# Patient Record
Sex: Male | Born: 1955 | Race: Black or African American | Hispanic: No | Marital: Married | Smoking: Current every day smoker
Health system: Southern US, Community
[De-identification: ages and names within clinical notes are randomized; demographics above are authoritative.]

## PROBLEM LIST (undated history)

## (undated) DIAGNOSIS — E785 Hyperlipidemia, unspecified: Secondary | ICD-10-CM

## (undated) DIAGNOSIS — E119 Type 2 diabetes mellitus without complications: Secondary | ICD-10-CM

## (undated) DIAGNOSIS — I10 Essential (primary) hypertension: Secondary | ICD-10-CM

## (undated) DIAGNOSIS — H409 Unspecified glaucoma: Secondary | ICD-10-CM

## (undated) DIAGNOSIS — Z8619 Personal history of other infectious and parasitic diseases: Secondary | ICD-10-CM

## (undated) DIAGNOSIS — C801 Malignant (primary) neoplasm, unspecified: Secondary | ICD-10-CM

## (undated) DIAGNOSIS — K219 Gastro-esophageal reflux disease without esophagitis: Secondary | ICD-10-CM

## (undated) DIAGNOSIS — C220 Liver cell carcinoma: Secondary | ICD-10-CM

## (undated) HISTORY — DX: Hyperlipidemia, unspecified: E78.5

## (undated) HISTORY — DX: Unspecified glaucoma: H40.9

## (undated) HISTORY — DX: Essential (primary) hypertension: I10

## (undated) HISTORY — DX: Personal history of other infectious and parasitic diseases: Z86.19

## (undated) HISTORY — PX: ROTATOR CUFF REPAIR: SHX139

## (undated) HISTORY — DX: Liver cell carcinoma: C22.0

## (undated) HISTORY — DX: Gastro-esophageal reflux disease without esophagitis: K21.9

## (undated) HISTORY — DX: Type 2 diabetes mellitus without complications: E11.9

## (undated) MED FILL — Ipilimumab Soln for IV Infusion 200 MG/40ML (5 MG/ML): INTRAVENOUS | Qty: 50 | Status: AC

## (undated) NOTE — *Deleted (*Deleted)
[facility]  [address] [place],  [place]  [address] [phone]  Clinic Day:  07/26/2020  Referring physician: Lonie Peak, PA-C   CHIEF COMPLAINT:  CC: Hepatocellular Carcinoma  Current Treatment:  ipilimumab/nivolumab   HISTORY OF PRESENT ILLNESS:  The patient is a 81 year old with stage IIIA (T3 N0 M0) multifocal hepatocellular carcinoma diagnosed in February 2021.  He was referred by Lonie Peak, PA-C in January due to abnormal liver function tests.  He has a history of hepatitis C, which was treated.  Due to abdominal pain and bloating, diarrhea, edema and right upper quadrant ultrasound was done in January.  This revealed at least 3 masses within the liver, the largest measured 10.2 x 9.3 x 9.5 cm.  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  The patient was started on rivaroxaban and then referred for consultation.  He had worsening anemia with a hemoglobin of 10.1.  B12 and folate were normal.  Iron studies were equivocal with a low serum iron of 34 and% saturation of 8.3% and high normal TIBC of 409, but ferritin was normal at 122.  A soluble transferrin was obtained and was very elevated, consistent with iron deficiency.  Alpha-fetoprotein was normal, but his CA 19-9 was elevated.  CT chest, abdomen, and pelvis in January confirmed the multiple hepatic masses, favored to represent multifocal hepatocellular carcinoma in the setting of cirrhosis.  The largest mass measured 9.8 x 7.5 cm in the high right hepatic lobe.  Perihepatic hemorrhage was centered about the left hepatic lobe, including a hematoma measuring 8.1 x 11.0 cm.  No acute process or evidence of metastatic disease was seen in the chest.  MRI head did not reveal any intracranial metastasis.  Core liver biopsy was done in February.  Pathology from this procedure revealed well to moderately  differentiated hepatocellular carcinoma, grade 1.  Due to the perihepatic hemorrhage, rivaroxaban was discontinued.  Due to the iron deficiency, he was advised start on oral iron supplement.  He had worsening hyperbilirubinemia in February prior to starting treatment, with his bilirubin up to 7.6, from 1.4.  Palliative treatment with single agent atezolizumab was recommended and started on February 25th.  This is usually given in combination with bevacizumab, but due to the hemorrhage within the tumor, we did not recommend bevacizumab.  His bilirubin went down to 5.7.  His 2nd cycle of palliative atezolizumab was delayed on March 18th due to worsening diarrhea, which he had for about 2 weeks.  Stool studies were negative.   He did not tolerate oral iron supplement, so we gave him IV iron in the form of Feraheme.  His CA 19-9 had increased from 246 in January to 358 in April, then down to 282 in early May.    CT chest, abdomen and pelvis from May 24th revealed the dominant right hepatic lesion is progressive in the interval, now measuring 11.6 x 9.8 cm, previously 9.8 x 7.5 cm.  The left hepatic mass remains poorly defined making reproducible comparative measurement difficult, but this lesion is similar to prior, now measuring 6.9 x 5.0 cm, previously 7.6 x 5.7 cm.  There is a progressive enhancing lesion in the inferior right liver measuring 2.9 cm.  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  Question of  IVC thrombus on the previous study is less evident.  With these results, we stopped his treatment.  The patient started Lenvima 12 mg daily on 02/10/2020.  On 02/17/2020, he called reporting severe diarrhea that was not improved with Imodium.  We decreased his dose due to toxicities and started Lomotil p.r.n..  He then required Lenvima to be held due to Grade 3 hepatotoxicity for a total of two weeks.  He  restarted Lenvima at 8 mg but continued to have severe toxicities of diarrhea and weight loss.  He was unable to tolerate even 4 mg of the Lenvima. The oxycodone was not sufficient to relieve his pain and he was changed to hydromorphone. Tramadol and hydrocodone did not provide sufficient relief.  He has not tolerated oxycodone, so he was placed on morphine 15 mg Q4 prn.  CT abdomen and pelvis from September 21st revealed multiple enlarging, bulky liver masses, and an additional new lesion, measuring 1.5 x 1.3 cm.  The largest mass in the right lobe measures 12.3 x 10.0 cm, previously 11.6 x 9.8 cm.  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  His last CA 19-9 from early September was 438, previously 299 in July.  He was started on ipilimumab/nivolumab immunotherapy on September 28th.   INTERVAL HISTORY:  Philbert is here for follow up since experiencing dizziness with a fall last night. He states as he was walking down the stairs, he became light-headed and fell down the steps hitting his left side along the way including his head. He does think he had a slight loss of consciousness. Today he reports being very sore, mainly his left ankle, leg and chest. He also reports not being able to eat or drink well since Sunday of last week with today being Thursday. He is concerned that he may have injured himself and also feels dehydrated. We will plan for administration of IVF along with imaging of his left side as well as a head CT. He denies fever, chills, nausea or vomiting. He denies issue with bowel or bladder. He has had increasing shortness of breath as well as fatigue. CBC and CMP are unremarkable today.  He is here for follow up and continued supportive care.  He will be due for a 3rd cycle of ipilimumab/nivolumab on November 24th.   His  appetite is good, and he has gained/lost _ pounds since his last visit.  He denies fever, chills or other signs of infection.  He denies  nausea, vomiting, bowel issues, or abdominal pain.  He denies sore throat, cough, dyspnea, or chest pain.   REVIEW OF SYSTEMS:  Review of Systems - Oncology   VITALS:  There were no vitals taken for this visit.  Wt Readings from Last 3 Encounters:  07/13/20 180 lb (81.6 kg)  07/11/20 174 lb 5 oz (79.1 kg)  07/11/20 181 lb 12.8 oz (82.5 kg)    There is no height or weight on file to calculate BMI.  Performance status (ECOG): 1 - Symptomatic but completely ambulatory  PHYSICAL EXAM:  Physical Exam LABS:   STUDIES:    Allergies:  Allergies  Allergen Reactions  . Hydromorphone Itching  . Lortab [Hydrocodone-Acetaminophen] Itching    Current Medications: Current Outpatient Medications  Medication Sig Dispense Refill  . diphenoxylate-atropine (LOMOTIL) 2.5-0.025 MG tablet Take by mouth.    . furosemide (LASIX) 20 MG tablet Take 20 mg by mouth daily.    Marland Kitchen glimepiride (AMARYL) 2 MG tablet     .  HYDROmorphone (DILAUDID) 2 MG tablet Take 2 mg by mouth 4 (four) times daily.    . hydrOXYzine (ATARAX/VISTARIL) 25 MG tablet Take 25 mg by mouth 3 (three) times daily as needed.    . hydrOXYzine (VISTARIL) 25 MG capsule TAKE 1 CAPSULE BY MOUTH EVERY 6 HOURS AS NEEDED FOR ITCHING 60 capsule 0  . lisinopril (ZESTRIL) 5 MG tablet Take 5 mg by mouth daily.    Marland Kitchen loperamide (IMODIUM) 2 MG capsule Take by mouth as needed for diarrhea or loose stools.    . mirtazapine (REMERON) 30 MG tablet Take 30 mg by mouth at bedtime.    Marland Kitchen morphine (MS CONTIN) 15 MG 12 hr tablet Take 15 mg by mouth 2 (two) times daily as needed.    . Multiple Vitamin (MULTIVITAMIN) tablet Take 1 tablet by mouth daily.    . ondansetron (ZOFRAN) 4 MG tablet Take 4 mg by mouth every 8 (eight) hours as needed for nausea or vomiting.    . polyethylene glycol (MIRALAX / GLYCOLAX) 17 g packet Take 17 g by mouth daily.    . prochlorperazine (COMPAZINE) 10 MG tablet Take 10 mg by mouth every 6 (six) hours as needed for nausea or  vomiting.    . sennosides-docusate sodium (SENOKOT-S) 8.6-50 MG tablet Take 1 tablet by mouth daily.     No current facility-administered medications for this visit.     ASSESSMENT & PLAN:   Assessment:  1. Stage IIIA hepatocellular carcinoma, for which he was receiving palliative atezolizumab. However, imaging results revealed progressive disease, and was therefore stopped.  He began lenvatinib 12 mg daily on 02/10/2020.  This was decreased to 8 mg daily on 02/17/2020. He was unable to tolerate this even after holding the dose and then decreasing to 4mg  daily. Due to continued toxicities, Lenvima was discontinued.  We have now started him on ipilimumab/ nivolumab which he is tolerating fairly well.  2. Hemorrhage into the tumor, resolved.    3. Iron deficiency anemia, improved on IV iron.  He has not required IV iron since March.    4. Hyperbilirubinemia and elevation of the liver transaminases.  These have started to improve, but we will still need to hold his therapy for another week.  Most likely this is treatment induced.  5. 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  He has been instructed to stay off anticoagulant or aspirin.  6. History of hepatitis C, treated.  7. Chronic lower extremity swelling and pain, which is stable.  He continues hydrochlorothiazide.  8. Renal insufficiency, stable.    9.  Elevated liver function tests for the last 3 weeks, improving, so likely from his treatment.  I cannot rule out that this is related to his malignancy.  10. Recent fall with LOC. Pain to left side. Imaging negative for fractures; however, he is noted to have a 7 mm right subdural hematoma. We will observe as there is no intervention indicated.    Plan: We discussed for him to have close observation by his wife at home. They are to call with any changes especially mental status changes. He will use ice as necessary with elevation to his left ankle. He has pain medicines to relieve  his pain. He understands it may take several days to recover from this fall. We will give 1 liter of normal saline today. He will return to clinic next week for repeat evaluation.   I provided 30 minutes (9:35 -10:04 AM) of face-to-face time during this this encounter and >  50% was spent counseling as documented under my assessment and plan.    Dellia Beckwith, MD [facility] [place]   .

---

## 2019-07-23 DIAGNOSIS — E785 Hyperlipidemia, unspecified: Secondary | ICD-10-CM | POA: Diagnosis not present

## 2019-07-23 DIAGNOSIS — R5383 Other fatigue: Secondary | ICD-10-CM | POA: Diagnosis not present

## 2019-07-23 DIAGNOSIS — Z23 Encounter for immunization: Secondary | ICD-10-CM | POA: Diagnosis not present

## 2019-07-23 DIAGNOSIS — E119 Type 2 diabetes mellitus without complications: Secondary | ICD-10-CM | POA: Diagnosis not present

## 2019-07-23 DIAGNOSIS — I10 Essential (primary) hypertension: Secondary | ICD-10-CM | POA: Diagnosis not present

## 2019-08-20 DIAGNOSIS — L851 Acquired keratosis [keratoderma] palmaris et plantaris: Secondary | ICD-10-CM | POA: Diagnosis not present

## 2019-08-20 DIAGNOSIS — L600 Ingrowing nail: Secondary | ICD-10-CM | POA: Diagnosis not present

## 2019-08-20 DIAGNOSIS — B351 Tinea unguium: Secondary | ICD-10-CM | POA: Diagnosis not present

## 2019-08-20 DIAGNOSIS — E119 Type 2 diabetes mellitus without complications: Secondary | ICD-10-CM | POA: Diagnosis not present

## 2019-08-20 DIAGNOSIS — M79671 Pain in right foot: Secondary | ICD-10-CM | POA: Diagnosis not present

## 2019-09-13 DIAGNOSIS — L600 Ingrowing nail: Secondary | ICD-10-CM | POA: Diagnosis not present

## 2019-09-13 DIAGNOSIS — B351 Tinea unguium: Secondary | ICD-10-CM | POA: Diagnosis not present

## 2019-09-13 DIAGNOSIS — M79671 Pain in right foot: Secondary | ICD-10-CM | POA: Diagnosis not present

## 2019-09-13 DIAGNOSIS — E119 Type 2 diabetes mellitus without complications: Secondary | ICD-10-CM | POA: Diagnosis not present

## 2019-09-17 DIAGNOSIS — R609 Edema, unspecified: Secondary | ICD-10-CM | POA: Diagnosis not present

## 2019-09-17 DIAGNOSIS — I10 Essential (primary) hypertension: Secondary | ICD-10-CM | POA: Diagnosis not present

## 2019-09-17 DIAGNOSIS — L723 Sebaceous cyst: Secondary | ICD-10-CM | POA: Diagnosis not present

## 2019-09-27 DIAGNOSIS — R160 Hepatomegaly, not elsewhere classified: Secondary | ICD-10-CM | POA: Diagnosis not present

## 2019-09-27 DIAGNOSIS — K828 Other specified diseases of gallbladder: Secondary | ICD-10-CM | POA: Diagnosis not present

## 2019-09-27 DIAGNOSIS — R748 Abnormal levels of other serum enzymes: Secondary | ICD-10-CM | POA: Diagnosis not present

## 2019-09-27 DIAGNOSIS — I81 Portal vein thrombosis: Secondary | ICD-10-CM | POA: Diagnosis not present

## 2019-09-27 DIAGNOSIS — R188 Other ascites: Secondary | ICD-10-CM | POA: Diagnosis not present

## 2019-09-27 DIAGNOSIS — K8020 Calculus of gallbladder without cholecystitis without obstruction: Secondary | ICD-10-CM | POA: Diagnosis not present

## 2019-09-27 DIAGNOSIS — C220 Liver cell carcinoma: Secondary | ICD-10-CM | POA: Insufficient documentation

## 2019-10-05 DIAGNOSIS — R188 Other ascites: Secondary | ICD-10-CM | POA: Diagnosis not present

## 2019-10-05 DIAGNOSIS — E785 Hyperlipidemia, unspecified: Secondary | ICD-10-CM | POA: Diagnosis not present

## 2019-10-05 DIAGNOSIS — E119 Type 2 diabetes mellitus without complications: Secondary | ICD-10-CM | POA: Diagnosis not present

## 2019-10-05 DIAGNOSIS — R945 Abnormal results of liver function studies: Secondary | ICD-10-CM | POA: Diagnosis not present

## 2019-10-05 DIAGNOSIS — I82220 Acute embolism and thrombosis of inferior vena cava: Secondary | ICD-10-CM | POA: Diagnosis not present

## 2019-10-05 DIAGNOSIS — Z79899 Other long term (current) drug therapy: Secondary | ICD-10-CM | POA: Diagnosis not present

## 2019-10-05 DIAGNOSIS — E538 Deficiency of other specified B group vitamins: Secondary | ICD-10-CM | POA: Diagnosis not present

## 2019-10-05 DIAGNOSIS — I81 Portal vein thrombosis: Secondary | ICD-10-CM | POA: Diagnosis not present

## 2019-10-05 DIAGNOSIS — R978 Other abnormal tumor markers: Secondary | ICD-10-CM | POA: Diagnosis not present

## 2019-10-05 DIAGNOSIS — I10 Essential (primary) hypertension: Secondary | ICD-10-CM | POA: Diagnosis not present

## 2019-10-05 DIAGNOSIS — R160 Hepatomegaly, not elsewhere classified: Secondary | ICD-10-CM | POA: Diagnosis not present

## 2019-10-06 DIAGNOSIS — Z20822 Contact with and (suspected) exposure to covid-19: Secondary | ICD-10-CM | POA: Diagnosis not present

## 2019-10-08 DIAGNOSIS — K7689 Other specified diseases of liver: Secondary | ICD-10-CM | POA: Diagnosis not present

## 2019-10-08 DIAGNOSIS — J439 Emphysema, unspecified: Secondary | ICD-10-CM | POA: Diagnosis not present

## 2019-10-08 DIAGNOSIS — R945 Abnormal results of liver function studies: Secondary | ICD-10-CM | POA: Diagnosis not present

## 2019-10-08 DIAGNOSIS — R9389 Abnormal findings on diagnostic imaging of other specified body structures: Secondary | ICD-10-CM | POA: Diagnosis not present

## 2019-10-12 DIAGNOSIS — R160 Hepatomegaly, not elsewhere classified: Secondary | ICD-10-CM | POA: Diagnosis not present

## 2019-10-12 DIAGNOSIS — K7689 Other specified diseases of liver: Secondary | ICD-10-CM | POA: Diagnosis not present

## 2019-10-14 DIAGNOSIS — R9389 Abnormal findings on diagnostic imaging of other specified body structures: Secondary | ICD-10-CM | POA: Diagnosis not present

## 2019-10-14 DIAGNOSIS — Z7901 Long term (current) use of anticoagulants: Secondary | ICD-10-CM | POA: Diagnosis not present

## 2019-10-18 ENCOUNTER — Encounter: Payer: Self-pay | Admitting: Oncology

## 2019-10-18 DIAGNOSIS — R945 Abnormal results of liver function studies: Secondary | ICD-10-CM | POA: Diagnosis not present

## 2019-10-18 DIAGNOSIS — R160 Hepatomegaly, not elsewhere classified: Secondary | ICD-10-CM | POA: Diagnosis not present

## 2019-10-18 DIAGNOSIS — C220 Liver cell carcinoma: Secondary | ICD-10-CM | POA: Diagnosis not present

## 2019-10-22 DIAGNOSIS — C787 Secondary malignant neoplasm of liver and intrahepatic bile duct: Secondary | ICD-10-CM | POA: Diagnosis not present

## 2019-10-22 DIAGNOSIS — D649 Anemia, unspecified: Secondary | ICD-10-CM | POA: Diagnosis not present

## 2019-10-22 DIAGNOSIS — C220 Liver cell carcinoma: Secondary | ICD-10-CM | POA: Diagnosis not present

## 2019-10-23 DIAGNOSIS — D509 Iron deficiency anemia, unspecified: Secondary | ICD-10-CM | POA: Insufficient documentation

## 2019-10-23 DIAGNOSIS — D689 Coagulation defect, unspecified: Secondary | ICD-10-CM | POA: Insufficient documentation

## 2019-10-25 DIAGNOSIS — E785 Hyperlipidemia, unspecified: Secondary | ICD-10-CM | POA: Diagnosis not present

## 2019-10-25 DIAGNOSIS — E119 Type 2 diabetes mellitus without complications: Secondary | ICD-10-CM | POA: Diagnosis not present

## 2019-10-25 DIAGNOSIS — I10 Essential (primary) hypertension: Secondary | ICD-10-CM | POA: Diagnosis not present

## 2019-10-25 DIAGNOSIS — K219 Gastro-esophageal reflux disease without esophagitis: Secondary | ICD-10-CM | POA: Diagnosis not present

## 2019-10-25 DIAGNOSIS — M5440 Lumbago with sciatica, unspecified side: Secondary | ICD-10-CM | POA: Diagnosis not present

## 2019-10-29 ENCOUNTER — Encounter: Payer: Self-pay | Admitting: Oncology

## 2019-10-29 DIAGNOSIS — Z452 Encounter for adjustment and management of vascular access device: Secondary | ICD-10-CM | POA: Diagnosis not present

## 2019-10-29 DIAGNOSIS — C220 Liver cell carcinoma: Secondary | ICD-10-CM | POA: Diagnosis not present

## 2019-11-04 DIAGNOSIS — Z79899 Other long term (current) drug therapy: Secondary | ICD-10-CM | POA: Diagnosis not present

## 2019-11-04 DIAGNOSIS — C220 Liver cell carcinoma: Secondary | ICD-10-CM | POA: Diagnosis not present

## 2019-11-15 DIAGNOSIS — C220 Liver cell carcinoma: Secondary | ICD-10-CM | POA: Diagnosis not present

## 2019-11-25 DIAGNOSIS — R945 Abnormal results of liver function studies: Secondary | ICD-10-CM | POA: Diagnosis not present

## 2019-11-25 DIAGNOSIS — C220 Liver cell carcinoma: Secondary | ICD-10-CM | POA: Diagnosis not present

## 2019-11-25 DIAGNOSIS — R600 Localized edema: Secondary | ICD-10-CM | POA: Diagnosis not present

## 2019-11-25 DIAGNOSIS — D509 Iron deficiency anemia, unspecified: Secondary | ICD-10-CM | POA: Diagnosis not present

## 2019-11-25 DIAGNOSIS — E860 Dehydration: Secondary | ICD-10-CM | POA: Diagnosis not present

## 2019-11-25 DIAGNOSIS — R197 Diarrhea, unspecified: Secondary | ICD-10-CM | POA: Diagnosis not present

## 2019-11-30 DIAGNOSIS — C220 Liver cell carcinoma: Secondary | ICD-10-CM | POA: Diagnosis not present

## 2019-12-07 DIAGNOSIS — C220 Liver cell carcinoma: Secondary | ICD-10-CM | POA: Diagnosis not present

## 2019-12-07 DIAGNOSIS — D509 Iron deficiency anemia, unspecified: Secondary | ICD-10-CM | POA: Diagnosis not present

## 2019-12-21 DIAGNOSIS — R978 Other abnormal tumor markers: Secondary | ICD-10-CM | POA: Diagnosis not present

## 2019-12-21 DIAGNOSIS — C220 Liver cell carcinoma: Secondary | ICD-10-CM | POA: Diagnosis not present

## 2019-12-21 DIAGNOSIS — C787 Secondary malignant neoplasm of liver and intrahepatic bile duct: Secondary | ICD-10-CM | POA: Diagnosis not present

## 2019-12-21 DIAGNOSIS — D649 Anemia, unspecified: Secondary | ICD-10-CM | POA: Diagnosis not present

## 2020-01-11 DIAGNOSIS — C787 Secondary malignant neoplasm of liver and intrahepatic bile duct: Secondary | ICD-10-CM | POA: Diagnosis not present

## 2020-01-11 DIAGNOSIS — D649 Anemia, unspecified: Secondary | ICD-10-CM | POA: Diagnosis not present

## 2020-01-11 DIAGNOSIS — Z79899 Other long term (current) drug therapy: Secondary | ICD-10-CM | POA: Diagnosis not present

## 2020-01-11 DIAGNOSIS — C220 Liver cell carcinoma: Secondary | ICD-10-CM | POA: Diagnosis not present

## 2020-01-21 DIAGNOSIS — E119 Type 2 diabetes mellitus without complications: Secondary | ICD-10-CM | POA: Diagnosis not present

## 2020-01-21 DIAGNOSIS — I10 Essential (primary) hypertension: Secondary | ICD-10-CM | POA: Diagnosis not present

## 2020-01-21 DIAGNOSIS — C220 Liver cell carcinoma: Secondary | ICD-10-CM | POA: Diagnosis not present

## 2020-01-21 DIAGNOSIS — E785 Hyperlipidemia, unspecified: Secondary | ICD-10-CM | POA: Diagnosis not present

## 2020-01-31 DIAGNOSIS — D649 Anemia, unspecified: Secondary | ICD-10-CM | POA: Diagnosis not present

## 2020-01-31 DIAGNOSIS — C220 Liver cell carcinoma: Secondary | ICD-10-CM | POA: Diagnosis not present

## 2020-01-31 DIAGNOSIS — C787 Secondary malignant neoplasm of liver and intrahepatic bile duct: Secondary | ICD-10-CM | POA: Diagnosis not present

## 2020-01-31 DIAGNOSIS — C229 Malignant neoplasm of liver, not specified as primary or secondary: Secondary | ICD-10-CM | POA: Diagnosis not present

## 2020-02-01 DIAGNOSIS — H348312 Tributary (branch) retinal vein occlusion, right eye, stable: Secondary | ICD-10-CM | POA: Diagnosis not present

## 2020-02-01 DIAGNOSIS — H353131 Nonexudative age-related macular degeneration, bilateral, early dry stage: Secondary | ICD-10-CM | POA: Diagnosis not present

## 2020-02-02 DIAGNOSIS — C787 Secondary malignant neoplasm of liver and intrahepatic bile duct: Secondary | ICD-10-CM | POA: Diagnosis not present

## 2020-02-02 DIAGNOSIS — C220 Liver cell carcinoma: Secondary | ICD-10-CM | POA: Diagnosis not present

## 2020-02-02 DIAGNOSIS — R945 Abnormal results of liver function studies: Secondary | ICD-10-CM | POA: Diagnosis not present

## 2020-02-02 DIAGNOSIS — R600 Localized edema: Secondary | ICD-10-CM | POA: Diagnosis not present

## 2020-02-02 DIAGNOSIS — D649 Anemia, unspecified: Secondary | ICD-10-CM | POA: Diagnosis not present

## 2020-02-02 DIAGNOSIS — D509 Iron deficiency anemia, unspecified: Secondary | ICD-10-CM | POA: Diagnosis not present

## 2020-02-22 DIAGNOSIS — M8589 Other specified disorders of bone density and structure, multiple sites: Secondary | ICD-10-CM | POA: Diagnosis not present

## 2020-02-22 DIAGNOSIS — D509 Iron deficiency anemia, unspecified: Secondary | ICD-10-CM | POA: Diagnosis not present

## 2020-02-22 DIAGNOSIS — C220 Liver cell carcinoma: Secondary | ICD-10-CM | POA: Diagnosis not present

## 2020-02-29 DIAGNOSIS — M79604 Pain in right leg: Secondary | ICD-10-CM | POA: Diagnosis not present

## 2020-02-29 DIAGNOSIS — N289 Disorder of kidney and ureter, unspecified: Secondary | ICD-10-CM | POA: Diagnosis not present

## 2020-02-29 DIAGNOSIS — D509 Iron deficiency anemia, unspecified: Secondary | ICD-10-CM | POA: Diagnosis not present

## 2020-02-29 DIAGNOSIS — Z8619 Personal history of other infectious and parasitic diseases: Secondary | ICD-10-CM | POA: Diagnosis not present

## 2020-02-29 DIAGNOSIS — M7989 Other specified soft tissue disorders: Secondary | ICD-10-CM | POA: Diagnosis not present

## 2020-02-29 DIAGNOSIS — R945 Abnormal results of liver function studies: Secondary | ICD-10-CM | POA: Diagnosis not present

## 2020-02-29 DIAGNOSIS — M79605 Pain in left leg: Secondary | ICD-10-CM | POA: Diagnosis not present

## 2020-02-29 DIAGNOSIS — C220 Liver cell carcinoma: Secondary | ICD-10-CM | POA: Diagnosis not present

## 2020-02-29 DIAGNOSIS — D688 Other specified coagulation defects: Secondary | ICD-10-CM | POA: Diagnosis not present

## 2020-03-01 DIAGNOSIS — H353131 Nonexudative age-related macular degeneration, bilateral, early dry stage: Secondary | ICD-10-CM | POA: Diagnosis not present

## 2020-03-01 DIAGNOSIS — H34831 Tributary (branch) retinal vein occlusion, right eye, with macular edema: Secondary | ICD-10-CM | POA: Diagnosis not present

## 2020-03-07 DIAGNOSIS — D689 Coagulation defect, unspecified: Secondary | ICD-10-CM | POA: Diagnosis not present

## 2020-03-07 DIAGNOSIS — Z8619 Personal history of other infectious and parasitic diseases: Secondary | ICD-10-CM | POA: Diagnosis not present

## 2020-03-07 DIAGNOSIS — C220 Liver cell carcinoma: Secondary | ICD-10-CM | POA: Diagnosis not present

## 2020-03-07 DIAGNOSIS — D509 Iron deficiency anemia, unspecified: Secondary | ICD-10-CM | POA: Diagnosis not present

## 2020-03-07 DIAGNOSIS — R600 Localized edema: Secondary | ICD-10-CM | POA: Diagnosis not present

## 2020-03-07 DIAGNOSIS — R945 Abnormal results of liver function studies: Secondary | ICD-10-CM | POA: Diagnosis not present

## 2020-03-22 DIAGNOSIS — R600 Localized edema: Secondary | ICD-10-CM | POA: Diagnosis not present

## 2020-03-22 DIAGNOSIS — D688 Other specified coagulation defects: Secondary | ICD-10-CM | POA: Diagnosis not present

## 2020-03-22 DIAGNOSIS — D509 Iron deficiency anemia, unspecified: Secondary | ICD-10-CM | POA: Diagnosis not present

## 2020-03-22 DIAGNOSIS — C220 Liver cell carcinoma: Secondary | ICD-10-CM | POA: Diagnosis not present

## 2020-04-03 DIAGNOSIS — C220 Liver cell carcinoma: Secondary | ICD-10-CM | POA: Diagnosis not present

## 2020-04-24 DIAGNOSIS — C220 Liver cell carcinoma: Secondary | ICD-10-CM | POA: Diagnosis not present

## 2020-04-28 DIAGNOSIS — Z9181 History of falling: Secondary | ICD-10-CM | POA: Diagnosis not present

## 2020-04-28 DIAGNOSIS — E119 Type 2 diabetes mellitus without complications: Secondary | ICD-10-CM | POA: Diagnosis not present

## 2020-04-28 DIAGNOSIS — C220 Liver cell carcinoma: Secondary | ICD-10-CM | POA: Diagnosis not present

## 2020-04-28 DIAGNOSIS — I10 Essential (primary) hypertension: Secondary | ICD-10-CM | POA: Diagnosis not present

## 2020-04-28 DIAGNOSIS — Z139 Encounter for screening, unspecified: Secondary | ICD-10-CM | POA: Diagnosis not present

## 2020-04-28 DIAGNOSIS — R609 Edema, unspecified: Secondary | ICD-10-CM | POA: Diagnosis not present

## 2020-05-16 DIAGNOSIS — C220 Liver cell carcinoma: Secondary | ICD-10-CM | POA: Diagnosis not present

## 2020-05-16 DIAGNOSIS — Z79899 Other long term (current) drug therapy: Secondary | ICD-10-CM | POA: Diagnosis not present

## 2020-05-29 DIAGNOSIS — C220 Liver cell carcinoma: Secondary | ICD-10-CM | POA: Diagnosis not present

## 2020-05-30 DIAGNOSIS — K7689 Other specified diseases of liver: Secondary | ICD-10-CM | POA: Diagnosis not present

## 2020-05-30 DIAGNOSIS — C220 Liver cell carcinoma: Secondary | ICD-10-CM | POA: Diagnosis not present

## 2020-05-30 DIAGNOSIS — I82890 Acute embolism and thrombosis of other specified veins: Secondary | ICD-10-CM | POA: Diagnosis not present

## 2020-05-30 DIAGNOSIS — I700 Atherosclerosis of aorta: Secondary | ICD-10-CM | POA: Diagnosis not present

## 2020-05-30 DIAGNOSIS — I81 Portal vein thrombosis: Secondary | ICD-10-CM | POA: Diagnosis not present

## 2020-06-06 DIAGNOSIS — C220 Liver cell carcinoma: Secondary | ICD-10-CM | POA: Diagnosis not present

## 2020-06-06 DIAGNOSIS — Z5111 Encounter for antineoplastic chemotherapy: Secondary | ICD-10-CM | POA: Diagnosis not present

## 2020-06-18 ENCOUNTER — Encounter: Payer: Self-pay | Admitting: Pharmacist

## 2020-06-18 DIAGNOSIS — C220 Liver cell carcinoma: Secondary | ICD-10-CM | POA: Insufficient documentation

## 2020-06-26 ENCOUNTER — Other Ambulatory Visit: Payer: Self-pay | Admitting: Pharmacist

## 2020-06-26 ENCOUNTER — Encounter: Payer: Self-pay | Admitting: Pharmacist

## 2020-06-26 ENCOUNTER — Other Ambulatory Visit: Payer: Self-pay | Admitting: Hematology and Oncology

## 2020-06-26 DIAGNOSIS — Z23 Encounter for immunization: Secondary | ICD-10-CM

## 2020-06-26 DIAGNOSIS — C220 Liver cell carcinoma: Secondary | ICD-10-CM | POA: Diagnosis not present

## 2020-06-26 DIAGNOSIS — C787 Secondary malignant neoplasm of liver and intrahepatic bile duct: Secondary | ICD-10-CM | POA: Diagnosis not present

## 2020-06-26 LAB — CBC AND DIFFERENTIAL
HCT: 31 — AB (ref 41–53)
Hemoglobin: 10.4 — AB (ref 13.5–17.5)
Platelets: 344 (ref 150–399)
WBC: 7.2

## 2020-06-26 LAB — COMPREHENSIVE METABOLIC PANEL
Albumin: 3.3 — AB (ref 3.5–5.0)
Calcium: 9.7 (ref 8.7–10.7)

## 2020-06-26 LAB — HEPATIC FUNCTION PANEL
AST: 247 — AB (ref 14–40)
Alkaline Phosphatase: 551 — AB (ref 25–125)

## 2020-06-26 LAB — BASIC METABOLIC PANEL
BUN: 14 (ref 4–21)
CO2: 24 — AB (ref 13–22)
Chloride: 109 — AB (ref 99–108)
Creatinine: 1.5 — AB (ref 0.6–1.3)
Glucose: 101
Potassium: 3.6 (ref 3.4–5.3)
Sodium: 139 (ref 137–147)

## 2020-06-26 LAB — ALT: ALT: 441 — AB (ref 3–30)

## 2020-06-26 MED FILL — Nivolumab IV Soln 100 MG/10ML: INTRAVENOUS | Qty: 7.8 | Status: AC

## 2020-06-27 ENCOUNTER — Inpatient Hospital Stay: Payer: Medicare Other

## 2020-07-04 DIAGNOSIS — C220 Liver cell carcinoma: Secondary | ICD-10-CM | POA: Diagnosis not present

## 2020-07-04 DIAGNOSIS — C787 Secondary malignant neoplasm of liver and intrahepatic bile duct: Secondary | ICD-10-CM | POA: Diagnosis not present

## 2020-07-06 ENCOUNTER — Inpatient Hospital Stay: Payer: Medicare Other

## 2020-07-11 ENCOUNTER — Encounter: Payer: Self-pay | Admitting: Oncology

## 2020-07-11 ENCOUNTER — Encounter (INDEPENDENT_AMBULATORY_CARE_PROVIDER_SITE_OTHER): Payer: Self-pay

## 2020-07-11 ENCOUNTER — Other Ambulatory Visit: Payer: Self-pay

## 2020-07-11 ENCOUNTER — Telehealth: Payer: Self-pay | Admitting: Oncology

## 2020-07-11 ENCOUNTER — Other Ambulatory Visit: Payer: Self-pay | Admitting: Hematology and Oncology

## 2020-07-11 ENCOUNTER — Inpatient Hospital Stay: Payer: Medicare Other

## 2020-07-11 ENCOUNTER — Inpatient Hospital Stay: Payer: Medicare Other | Attending: Oncology | Admitting: Oncology

## 2020-07-11 VITALS — BP 148/87 | HR 88 | Temp 98.7°F | Resp 18 | Wt 181.8 lb

## 2020-07-11 DIAGNOSIS — Z79899 Other long term (current) drug therapy: Secondary | ICD-10-CM | POA: Diagnosis not present

## 2020-07-11 DIAGNOSIS — Z8619 Personal history of other infectious and parasitic diseases: Secondary | ICD-10-CM

## 2020-07-11 DIAGNOSIS — K7460 Unspecified cirrhosis of liver: Secondary | ICD-10-CM | POA: Insufficient documentation

## 2020-07-11 DIAGNOSIS — C220 Liver cell carcinoma: Secondary | ICD-10-CM | POA: Diagnosis not present

## 2020-07-11 DIAGNOSIS — K7469 Other cirrhosis of liver: Secondary | ICD-10-CM

## 2020-07-11 DIAGNOSIS — Z5112 Encounter for antineoplastic immunotherapy: Secondary | ICD-10-CM | POA: Insufficient documentation

## 2020-07-11 DIAGNOSIS — D500 Iron deficiency anemia secondary to blood loss (chronic): Secondary | ICD-10-CM | POA: Diagnosis not present

## 2020-07-11 DIAGNOSIS — D649 Anemia, unspecified: Secondary | ICD-10-CM | POA: Insufficient documentation

## 2020-07-11 DIAGNOSIS — Z23 Encounter for immunization: Secondary | ICD-10-CM | POA: Insufficient documentation

## 2020-07-11 LAB — BASIC METABOLIC PANEL
BUN: 11 (ref 4–21)
CO2: 21 (ref 13–22)
Chloride: 110 — AB (ref 99–108)
Creatinine: 1.5 — AB (ref 0.6–1.3)
Glucose: 162
Potassium: 3.8 (ref 3.4–5.3)
Sodium: 138 (ref 137–147)

## 2020-07-11 LAB — HEPATIC FUNCTION PANEL
ALT: 271 — AB (ref 10–40)
AST: 167 — AB (ref 14–40)
Alkaline Phosphatase: 530 — AB (ref 25–125)
Bilirubin, Total: 1.7

## 2020-07-11 LAB — COMPREHENSIVE METABOLIC PANEL
Albumin: 3.2 — AB (ref 3.5–5.0)
Calcium: 9.8 (ref 8.7–10.7)

## 2020-07-11 LAB — CBC AND DIFFERENTIAL
HCT: 35 — AB (ref 41–53)
Hemoglobin: 11.8 — AB (ref 13.5–17.5)
Neutrophils Absolute: 3.53
Platelets: 295 (ref 150–399)
WBC: 5.7

## 2020-07-11 LAB — CBC: RBC: 3.73 — AB (ref 3.87–5.11)

## 2020-07-11 LAB — TSH: TSH: 2.13 (ref 0.41–5.90)

## 2020-07-11 NOTE — Telephone Encounter (Signed)
Per 11/2 LOS appt sched.Left voicemail with patients appt schedule.

## 2020-07-11 NOTE — Progress Notes (Signed)
PT STABLE AT TIME OF DISCHARGE 

## 2020-07-11 NOTE — Progress Notes (Signed)
[facility]  [address] [place],  [place]  [address] [phone]  Clinic Day:  07/12/2020  Referring physician: No ref. provider found   CHIEF COMPLAINT:  CC: Hepatocellular Carcinoma  Current Treatment:  ipilimumab/nivolumab   HISTORY OF PRESENT ILLNESS:  The patient is a 64 year old with stage IIIA (T3 N0 M0) multifocal hepatocellular carcinoma diagnosed in February 2021.  Scott Farmer was referred by Cyndi Bender, PA-C in January due to abnormal liver function tests.  Scott Farmer has a history of hepatitis C, which was treated.  Due to abdominal pain and bloating, diarrhea, edema and right upper quadrant ultrasound was done in January.  This revealed at least 3 masses within the liver, the largest measured 10.2 x 9.3 x 9.5 cm.  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  The patient was started on rivaroxaban and then referred for consultation.  Scott Farmer had worsening anemia with a hemoglobin of 10.1.  B12 and folate were normal.  Iron studies were equivocal with a low serum iron of 34 and% saturation of 8.3% and high normal TIBC of 409, but ferritin was normal at 122.  A soluble transferrin was obtained and was very elevated, consistent with iron deficiency.  Alpha-fetoprotein was normal, but his CA 19-9 was elevated.  CT chest, abdomen, and pelvis in January confirmed the multiple hepatic masses, favored to represent multifocal hepatocellular carcinoma in the setting of cirrhosis.  The largest mass measured 9.8 x 7.5 cm in the high right hepatic lobe.  Perihepatic hemorrhage was centered about the left hepatic lobe, including a hematoma measuring 8.1 x 11.0 cm.  No acute process or evidence of metastatic disease was seen in the chest.  MRI head did not reveal any intracranial metastasis.  Core liver biopsy was done in February.  Pathology from this procedure revealed well to moderately  differentiated hepatocellular carcinoma, grade 1.  Due to the perihepatic hemorrhage, rivaroxaban was discontinued.  Due to the iron deficiency, Scott Farmer was advised start on oral iron supplement.  Scott Farmer had worsening hyperbilirubinemia in February prior to starting treatment, with his bilirubin up to 7.6, from 1.4.  Palliative treatment with single agent atezolizumab was recommended and started on February 25th.  This is usually given in combination with bevacizumab, but due to the hemorrhage within the tumor, we did not recommend bevacizumab.  His bilirubin went down to 5.7.  His 2nd cycle of palliative atezolizumab was delayed on March 18th due to worsening diarrhea, which Scott Farmer had for about 2 weeks.  Stool studies were negative.   Scott Farmer did not tolerate oral iron supplement, so we gave him IV iron in the form of Feraheme.  His CA 19-9 had increased from 246 in January to 358 in April, then down to 282 in early May.    CT chest, abdomen and pelvis from May 24th revealed the dominant right hepatic lesion is progressive in the interval, now measuring 11.6 x 9.8 cm, previously 9.8 x 7.5 cm.  The left hepatic mass remains poorly defined making reproducible comparative measurement difficult, but this lesion is similar to prior, now measuring 6.9 x 5.0 cm, previously 7.6 x 5.7 cm.  There is a progressive enhancing lesion in the inferior right liver measuring 2.9 cm.  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  Question  of IVC thrombus on the previous study is less evident.  With these results, we stopped his treatment.  The patient started Lenvima 12 mg daily on 02/10/2020.  On 02/17/2020, Scott Farmer called reporting severe diarrhea that was not improved with Imodium.  We decreased his dose due to toxicities and started Lomotil p.r.n..  Scott Farmer then required Lenvima to be held due to Grade 3 hepatotoxicity for a total of two weeks.  Scott Farmer  restarted Lenvima at 8 mg but continued to have severe toxicities of diarrhea and weight loss.  Scott Farmer was unable to tolerate even 4 mg of the Lenvima. The oxycodone was not sufficient to relieve his pain and Scott Farmer was changed to hydromorphone. Tramadol and hydrocodone did not provide sufficient relief.  Scott Farmer has not tolerated oxycodone, so Scott Farmer was placed on morphine 15 mg Q4 prn.  CT abdomen and pelvis from September 21st revealed multiple enlarging, bulky liver masses, and an additional new lesion, measuring 1.5 x 1.3 cm.  The largest mass in the right lobe measures 12.3 x 10.0 cm, previously 11.6 x 9.8 cm.  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  His last CA 19-9 from early September was 438, previously 299 in July.  Scott Farmer was started on ipilimumab/nivolumab immunotherapy on September 28th.   INTERVAL HISTORY:  Scott Farmer is here for follow up prior to a potential 2nd cycle of ipilimumab/nivolumab.  This has been postponed for the past 2 weeks due to elevated liver function tests.  His pain is well controlled since we added MS Contin 15 mg twice daily, and Scott Farmer has only averaged 2 morphine 15 mg daily.  Scott Farmer is slightly frustrated as Scott Farmer remains severely fatigued and cannot keep up with his daily activities.  His hemoglobin has improved from 11.1 to 11.8, and his white count and platelets are normal.  Chemistries are remarkable for a creatinine of 1.5, previously 1.7, a stable bilirubin of 1.7, and improved LFTs with a SGOT of 167, and an SGPT of 271.  His appetite is good, and Scott Farmer has gained 10 pounds since his last visit.  Scott Farmer denies fever, chills or other signs of infection.  Scott Farmer denies nausea, vomiting, bowel issues, or abdominal pain.  Scott Farmer denies sore throat, cough, dyspnea, or chest pain.   REVIEW OF SYSTEMS:  Review of Systems  Constitutional: Positive for fatigue (severe).  All other systems reviewed and are negative.    VITALS:  Blood pressure (!) 148/87, pulse 88, temperature 98.7  F (37.1 C), resp. rate 18, weight 181 lb 12.8 oz (82.5 kg), SpO2 97 %.  Wt Readings from Last 3 Encounters:  07/11/20 174 lb 5 oz (79.1 kg)  07/11/20 181 lb 12.8 oz (82.5 kg)  06/18/20 171 lb 8 oz (77.8 kg)    Body mass index is 26.09 kg/m.  Performance status (ECOG): 1 - Symptomatic but completely ambulatory  PHYSICAL EXAM:  Physical Exam Constitutional:      Appearance: Normal appearance. Scott Farmer is normal weight.  HENT:     Head: Normocephalic and atraumatic.  Eyes:     General: No scleral icterus.    Extraocular Movements: Extraocular movements intact.     Conjunctiva/sclera: Conjunctivae normal.     Pupils: Pupils are equal, round, and reactive to light.  Cardiovascular:     Rate and Rhythm: Normal rate and regular rhythm.     Pulses: Normal pulses.     Heart sounds: Normal heart sounds. No murmur heard.  No friction rub. No gallop.  Pulmonary:     Effort: Pulmonary effort is normal.     Breath sounds: Normal breath sounds.  Abdominal:     General: Bowel sounds are normal. There is no distension.     Palpations: Abdomen is soft. There is hepatomegaly (Liver measures 18 cm below the right costal marigin with an increased left lobe which is firm) and splenomegaly (mild). There is no mass.     Tenderness: There is no abdominal tenderness.  Musculoskeletal:        General: Normal range of motion.     Cervical back: Neck supple.     Right lower leg: No edema.     Left lower leg: Edema (mild) present.  Lymphadenopathy:     Cervical: No cervical adenopathy.  Skin:    General: Skin is warm and dry.  Neurological:     General: No focal deficit present.     Mental Status: Scott Farmer is alert and oriented to person, place, and time. Mental status is at baseline.    LABS:   His hemoglobin has improved from 11.1 to 11.8, and his white count and platelets are normal.  CMP was remarkable for a creatinine of 1.5, previously 1.7, a stable bilirubin of 1.7, and improved LFTs with a SGOT of  167, and an SGPT of 271.   STUDIES:  No results found.   Allergies:  Allergies  Allergen Reactions  . Hydromorphone Itching  . Lortab [Hydrocodone-Acetaminophen] Itching    Current Medications: Current Outpatient Medications  Medication Sig Dispense Refill  . glimepiride (AMARYL) 2 MG tablet     . ibuprofen (ADVIL) 800 MG tablet     . diphenoxylate-atropine (LOMOTIL) 2.5-0.025 MG tablet Take by mouth.    . furosemide (LASIX) 20 MG tablet Take 20 mg by mouth daily.    Marland Kitchen HYDROmorphone (DILAUDID) 2 MG tablet Take 2 mg by mouth 4 (four) times daily.    . hydrOXYzine (ATARAX/VISTARIL) 25 MG tablet Take 25 mg by mouth 3 (three) times daily as needed.    Marland Kitchen lisinopril (ZESTRIL) 5 MG tablet Take 5 mg by mouth daily.    Marland Kitchen loperamide (IMODIUM) 2 MG capsule Take by mouth as needed for diarrhea or loose stools.    . mirtazapine (REMERON) 30 MG tablet Take 30 mg by mouth at bedtime.    Marland Kitchen morphine (MS CONTIN) 15 MG 12 hr tablet Take 15 mg by mouth 2 (two) times daily as needed.    . Multiple Vitamin (MULTIVITAMIN) tablet Take 1 tablet by mouth daily.    Marland Kitchen omeprazole (PRILOSEC) 20 MG capsule Take 20 mg by mouth daily.    . ondansetron (ZOFRAN) 4 MG tablet Take 4 mg by mouth every 8 (eight) hours as needed for nausea or vomiting.    . polyethylene glycol (MIRALAX / GLYCOLAX) 17 g packet Take 17 g by mouth daily.    . prochlorperazine (COMPAZINE) 10 MG tablet Take 10 mg by mouth every 6 (six) hours as needed for nausea or vomiting.    . sennosides-docusate sodium (SENOKOT-S) 8.6-50 MG tablet Take 1 tablet by mouth daily.     No current facility-administered medications for this visit.     ASSESSMENT & PLAN:   Assessment:  1. Stage IIIA hepatocellular carcinoma, for which Scott Farmer was receiving palliative atezolizumab. However, imaging results revealed progressive disease, and was therefore stopped.  Scott Farmer began lenvatinib 12 mg daily on 02/10/2020.  This was decreased to 8 mg daily on 02/17/2020. Scott Farmer was  unable to tolerate this even  after holding the dose and then decreasing to 4mg  daily. Due to continued toxicities, Lenvima was discontinued.  We have now started him on ipilimumab/ nivolumab which Scott Farmer is tolerating fairly well.  2. Hemorrhage into the tumor, resolved.    3. Iron deficiency anemia, improved on IV iron.  Scott Farmer has not required IV iron since March.    4. Hyperbilirubinemia and elevation of the liver transaminases.  These have started to improve, but we will still need to hold his therapy for another week.  Most likely this is treatment induced.  5. 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  Scott Farmer has been instructed to stay off anticoagulant or aspirin.  6. History of hepatitis C, treated.  7. Chronic lower extremity swelling and pain, which is stable.  Scott Farmer continues hydrochlorothiazide.  8. Renal insufficiency, stable.    9.  Elevated liver function tests for the last 3 weeks, improving, so likely from his treatment.  I cannot rule out that this is related to his malignancy.   Plan: His liver function tests have improved enough to continue with his 2nd cycle of ipilimumab/ nivolumab.  We will see him back in 3 weeks with CBC and CMP prior to his 3rd cycle.  Scott Farmer will continue MS Contin 15 mg every 12 hours and MSIR 15 mg, 1-2 pills every 4 hours as needed for breakthrough pain.  The patient understands and is agreeable to the plans today. They know to call the office should any new questions or concerns arise.   I provided 29 minutes (9:35 -10:04 AM) of face-to-face time during this this encounter and > 50% was spent counseling as documented under my assessment and plan.    Derwood Kaplan, MD [place]   I, Lauren. Georgann Housekeeper, am acting as scribe for Derwood Kaplan, MD  I have reviewed this report as typed by the medical scribe, and it is complete and accurate.

## 2020-07-12 ENCOUNTER — Encounter: Payer: Self-pay | Admitting: Oncology

## 2020-07-13 ENCOUNTER — Other Ambulatory Visit: Payer: Self-pay

## 2020-07-13 ENCOUNTER — Inpatient Hospital Stay: Payer: Medicare Other

## 2020-07-13 VITALS — BP 125/75 | HR 81 | Temp 97.6°F | Resp 18 | Ht 70.0 in | Wt 180.0 lb

## 2020-07-13 DIAGNOSIS — C220 Liver cell carcinoma: Secondary | ICD-10-CM

## 2020-07-13 DIAGNOSIS — Z23 Encounter for immunization: Secondary | ICD-10-CM | POA: Diagnosis not present

## 2020-07-13 DIAGNOSIS — Z79899 Other long term (current) drug therapy: Secondary | ICD-10-CM | POA: Diagnosis not present

## 2020-07-13 DIAGNOSIS — Z5112 Encounter for antineoplastic immunotherapy: Secondary | ICD-10-CM | POA: Diagnosis not present

## 2020-07-13 MED ORDER — FAMOTIDINE IN NACL 20-0.9 MG/50ML-% IV SOLN
INTRAVENOUS | Status: AC
  Filled 2020-07-13: qty 50

## 2020-07-13 MED ORDER — SODIUM CHLORIDE 0.9 % IV SOLN
Freq: Once | INTRAVENOUS | Status: AC
  Filled 2020-07-13: qty 250

## 2020-07-13 MED ORDER — INFLUENZA VAC SPLIT QUAD 0.5 ML IM SUSY
PREFILLED_SYRINGE | INTRAMUSCULAR | Status: AC
  Filled 2020-07-13: qty 0.5

## 2020-07-13 MED ORDER — SODIUM CHLORIDE 0.9 % IV SOLN
1.0000 mg/kg | Freq: Once | INTRAVENOUS | Status: AC
  Administered 2020-07-13: 78 mg via INTRAVENOUS
  Filled 2020-07-13: qty 7.8

## 2020-07-13 MED ORDER — FAMOTIDINE IN NACL 20-0.9 MG/50ML-% IV SOLN
20.0000 mg | Freq: Once | INTRAVENOUS | Status: AC
  Administered 2020-07-13: 20 mg via INTRAVENOUS

## 2020-07-13 MED ORDER — DIPHENHYDRAMINE HCL 50 MG/ML IJ SOLN
INTRAMUSCULAR | Status: AC
  Filled 2020-07-13: qty 1

## 2020-07-13 MED ORDER — INFLUENZA VAC SPLIT QUAD 0.5 ML IM SUSY
0.5000 mL | PREFILLED_SYRINGE | Freq: Once | INTRAMUSCULAR | Status: AC
  Administered 2020-07-13: 0.5 mL via INTRAMUSCULAR
  Filled 2020-07-13: qty 0.5

## 2020-07-13 MED ORDER — SODIUM CHLORIDE 0.9 % IV SOLN
3.0000 mg/kg | Freq: Once | INTRAVENOUS | Status: AC
  Administered 2020-07-13: 250 mg via INTRAVENOUS
  Filled 2020-07-13: qty 10

## 2020-07-13 MED ORDER — HEPARIN SOD (PORK) LOCK FLUSH 100 UNIT/ML IV SOLN
500.0000 [IU] | Freq: Once | INTRAVENOUS | Status: AC | PRN
  Administered 2020-07-13: 500 [IU]
  Filled 2020-07-13: qty 5

## 2020-07-13 MED ORDER — SODIUM CHLORIDE 0.9% FLUSH
10.0000 mL | INTRAVENOUS | Status: DC | PRN
  Administered 2020-07-13: 10 mL
  Filled 2020-07-13: qty 10

## 2020-07-13 MED ORDER — DIPHENHYDRAMINE HCL 50 MG/ML IJ SOLN
25.0000 mg | Freq: Once | INTRAMUSCULAR | Status: AC
  Administered 2020-07-13: 25 mg via INTRAVENOUS

## 2020-07-13 NOTE — Progress Notes (Signed)
Pt stable at time of discharge. 

## 2020-07-13 NOTE — Patient Instructions (Signed)
[facility] Discharge Instructions for Patients Receiving Chemotherapy  Today you received the following chemotherapy agents Nivolumab, Ipilimumab  To help prevent nausea and vomiting after your treatment, we encourage you to take your nausea medication.   If you develop nausea and vomiting that is not controlled by your nausea medication, call the clinic.   BELOW ARE SYMPTOMS THAT SHOULD BE REPORTED IMMEDIATELY:  *FEVER GREATER THAN 100.5 F  *CHILLS WITH OR WITHOUT FEVER  NAUSEA AND VOMITING THAT IS NOT CONTROLLED WITH YOUR NAUSEA MEDICATION  *UNUSUAL SHORTNESS OF BREATH  *UNUSUAL BRUISING OR BLEEDING  TENDERNESS IN MOUTH AND THROAT WITH OR WITHOUT PRESENCE OF ULCERS  *URINARY PROBLEMS  *BOWEL PROBLEMS  UNUSUAL RASH Items with * indicate a potential emergency and should be followed up as soon as possible.  Feel free to call the clinic should you have any questions or concerns at The clinic phone number is [phone].  Please show the [facility] at check-in to the Emergency Department and triage nurse.

## 2020-07-18 ENCOUNTER — Ambulatory Visit: Payer: Medicare Other

## 2020-07-20 ENCOUNTER — Encounter: Payer: Self-pay | Admitting: Hematology and Oncology

## 2020-07-20 ENCOUNTER — Other Ambulatory Visit: Payer: Self-pay | Admitting: Hematology and Oncology

## 2020-07-20 ENCOUNTER — Inpatient Hospital Stay: Payer: Medicare Other

## 2020-07-20 ENCOUNTER — Inpatient Hospital Stay (INDEPENDENT_AMBULATORY_CARE_PROVIDER_SITE_OTHER): Payer: Medicare Other | Admitting: Hematology and Oncology

## 2020-07-20 ENCOUNTER — Other Ambulatory Visit: Payer: Self-pay

## 2020-07-20 VITALS — BP 114/66 | HR 89 | Temp 98.1°F | Resp 16 | Ht 70.0 in

## 2020-07-20 DIAGNOSIS — C220 Liver cell carcinoma: Secondary | ICD-10-CM

## 2020-07-20 DIAGNOSIS — B1920 Unspecified viral hepatitis C without hepatic coma: Secondary | ICD-10-CM

## 2020-07-20 DIAGNOSIS — I6201 Nontraumatic acute subdural hemorrhage: Secondary | ICD-10-CM | POA: Diagnosis not present

## 2020-07-20 DIAGNOSIS — R079 Chest pain, unspecified: Secondary | ICD-10-CM | POA: Diagnosis not present

## 2020-07-20 DIAGNOSIS — M7989 Other specified soft tissue disorders: Secondary | ICD-10-CM | POA: Diagnosis not present

## 2020-07-20 DIAGNOSIS — T1490XA Injury, unspecified, initial encounter: Secondary | ICD-10-CM

## 2020-07-20 DIAGNOSIS — S065X0A Traumatic subdural hemorrhage without loss of consciousness, initial encounter: Secondary | ICD-10-CM | POA: Diagnosis not present

## 2020-07-20 DIAGNOSIS — Z9181 History of falling: Secondary | ICD-10-CM | POA: Diagnosis not present

## 2020-07-20 DIAGNOSIS — Z79899 Other long term (current) drug therapy: Secondary | ICD-10-CM | POA: Diagnosis not present

## 2020-07-20 DIAGNOSIS — G9389 Other specified disorders of brain: Secondary | ICD-10-CM | POA: Diagnosis not present

## 2020-07-20 DIAGNOSIS — Z5112 Encounter for antineoplastic immunotherapy: Secondary | ICD-10-CM | POA: Diagnosis not present

## 2020-07-20 DIAGNOSIS — M79662 Pain in left lower leg: Secondary | ICD-10-CM | POA: Diagnosis not present

## 2020-07-20 DIAGNOSIS — D509 Iron deficiency anemia, unspecified: Secondary | ICD-10-CM

## 2020-07-20 DIAGNOSIS — M25562 Pain in left knee: Secondary | ICD-10-CM | POA: Diagnosis not present

## 2020-07-20 DIAGNOSIS — D689 Coagulation defect, unspecified: Secondary | ICD-10-CM | POA: Diagnosis not present

## 2020-07-20 DIAGNOSIS — N289 Disorder of kidney and ureter, unspecified: Secondary | ICD-10-CM | POA: Diagnosis not present

## 2020-07-20 DIAGNOSIS — Z23 Encounter for immunization: Secondary | ICD-10-CM | POA: Diagnosis not present

## 2020-07-20 DIAGNOSIS — Z8505 Personal history of malignant neoplasm of liver: Secondary | ICD-10-CM | POA: Diagnosis not present

## 2020-07-20 DIAGNOSIS — M79652 Pain in left thigh: Secondary | ICD-10-CM | POA: Diagnosis not present

## 2020-07-20 DIAGNOSIS — J9811 Atelectasis: Secondary | ICD-10-CM | POA: Diagnosis not present

## 2020-07-20 DIAGNOSIS — R519 Headache, unspecified: Secondary | ICD-10-CM | POA: Diagnosis not present

## 2020-07-20 LAB — HEPATIC FUNCTION PANEL
ALT: 131 — AB (ref 10–40)
AST: 140 — AB (ref 14–40)
Alkaline Phosphatase: 458 — AB (ref 25–125)
Bilirubin, Total: 2

## 2020-07-20 LAB — CBC AND DIFFERENTIAL
HCT: 34 — AB (ref 41–53)
Hemoglobin: 11.1 — AB (ref 13.5–17.5)
Neutrophils Absolute: 4.39
Platelets: 195 (ref 150–399)
WBC: 7.7

## 2020-07-20 LAB — COMPREHENSIVE METABOLIC PANEL
Albumin: 2.9 — AB (ref 3.5–5.0)
Calcium: 9.1 (ref 8.7–10.7)

## 2020-07-20 LAB — BASIC METABOLIC PANEL
BUN: 19 (ref 4–21)
CO2: 20 (ref 13–22)
Chloride: 107 (ref 99–108)
Creatinine: 1.6 — AB (ref 0.6–1.3)
Glucose: 103
Potassium: 4.1 (ref 3.4–5.3)
Sodium: 134 — AB (ref 137–147)

## 2020-07-20 LAB — CBC: RBC: 3.67 — AB (ref 3.87–5.11)

## 2020-07-20 MED ORDER — HEPARIN SOD (PORK) LOCK FLUSH 100 UNIT/ML IV SOLN
500.0000 [IU] | Freq: Once | INTRAVENOUS | Status: AC | PRN
  Administered 2020-07-20: 500 [IU]
  Filled 2020-07-20: qty 5

## 2020-07-20 MED ORDER — SODIUM CHLORIDE 0.9% FLUSH
10.0000 mL | Freq: Once | INTRAVENOUS | Status: AC | PRN
  Administered 2020-07-20: 10 mL
  Filled 2020-07-20: qty 10

## 2020-07-20 MED ORDER — SODIUM CHLORIDE 0.9 % IV SOLN
Freq: Once | INTRAVENOUS | Status: AC
  Filled 2020-07-20: qty 250

## 2020-07-20 NOTE — Patient Instructions (Signed)

## 2020-07-20 NOTE — Progress Notes (Signed)
Patients Wife, Lovey Newcomer, called me this morning stating that Urian had fell yesterday and she believes he is dehydrated. She wants to see if he can come in today to be seen, and would need transportation. I spoke with Katrina RN and Melissa,NP. They said for patient to come in as soon as he could, called and spoke with [employer]. They stated that someone should be there today by 8:30am, called patient and let them know. Did not set a pick up time, let [facility] know we would call when he was ready to go home.

## 2020-07-20 NOTE — Progress Notes (Signed)
[facility]  [address] [place],  [place]  [address] [phone]  Clinic Day:  07/20/2020  Referring physician: Cyndi Bender, PA-C   CHIEF COMPLAINT:  CC: Hepatocellular Carcinoma  Current Treatment:  ipilimumab/nivolumab   HISTORY OF PRESENT ILLNESS:  The patient is a 64 year old with stage IIIA (T3 N0 M0) multifocal hepatocellular carcinoma diagnosed in February 2021.  He was referred by Cyndi Bender, PA-C in January due to abnormal liver function tests.  He has a history of hepatitis C, which was treated.  Due to abdominal pain and bloating, diarrhea, edema and right upper quadrant ultrasound was done in January.  This revealed at least 3 masses within the liver, the largest measured 10.2 x 9.3 x 9.5 cm.  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  The patient was started on rivaroxaban and then referred for consultation.  He had worsening anemia with a hemoglobin of 10.1.  B12 and folate were normal.  Iron studies were equivocal with a low serum iron of 34 and% saturation of 8.3% and high normal TIBC of 409, but ferritin was normal at 122.  A soluble transferrin was obtained and was very elevated, consistent with iron deficiency.  Alpha-fetoprotein was normal, but his CA 19-9 was elevated.  CT chest, abdomen, and pelvis in January confirmed the multiple hepatic masses, favored to represent multifocal hepatocellular carcinoma in the setting of cirrhosis.  The largest mass measured 9.8 x 7.5 cm in the high right hepatic lobe.  Perihepatic hemorrhage was centered about the left hepatic lobe, including a hematoma measuring 8.1 x 11.0 cm.  No acute process or evidence of metastatic disease was seen in the chest.  MRI head did not reveal any intracranial metastasis.  Core liver biopsy was done in February.  Pathology from this procedure revealed well to moderately  differentiated hepatocellular carcinoma, grade 1.  Due to the perihepatic hemorrhage, rivaroxaban was discontinued.  Due to the iron deficiency, he was advised start on oral iron supplement.  He had worsening hyperbilirubinemia in February prior to starting treatment, with his bilirubin up to 7.6, from 1.4.  Palliative treatment with single agent atezolizumab was recommended and started on February 25th.  This is usually given in combination with bevacizumab, but due to the hemorrhage within the tumor, we did not recommend bevacizumab.  His bilirubin went down to 5.7.  His 2nd cycle of palliative atezolizumab was delayed on March 18th due to worsening diarrhea, which he had for about 2 weeks.  Stool studies were negative.   He did not tolerate oral iron supplement, so we gave him IV iron in the form of Feraheme.  His CA 19-9 had increased from 246 in January to 358 in April, then down to 282 in early May.    CT chest, abdomen and pelvis from May 24th revealed the dominant right hepatic lesion is progressive in the interval, now measuring 11.6 x 9.8 cm, previously 9.8 x 7.5 cm.  The left hepatic mass remains poorly defined making reproducible comparative measurement difficult, but this lesion is similar to prior, now measuring 6.9 x 5.0 cm, previously 7.6 x 5.7 cm.  There is a progressive enhancing lesion in the inferior right liver measuring 2.9 cm.  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  Question of  IVC thrombus on the previous study is less evident.  With these results, we stopped his treatment.  The patient started Lenvima 12 mg daily on 02/10/2020.  On 02/17/2020, he called reporting severe diarrhea that was not improved with Imodium.  We decreased his dose due to toxicities and started Lomotil p.r.n..  He then required Lenvima to be held due to Grade 3 hepatotoxicity for a total of two weeks.  He  restarted Lenvima at 8 mg but continued to have severe toxicities of diarrhea and weight loss.  He was unable to tolerate even 4 mg of the Lenvima. The oxycodone was not sufficient to relieve his pain and he was changed to hydromorphone. Tramadol and hydrocodone did not provide sufficient relief.  He has not tolerated oxycodone, so he was placed on morphine 15 mg Q4 prn.  CT abdomen and pelvis from September 21st revealed multiple enlarging, bulky liver masses, and an additional new lesion, measuring 1.5 x 1.3 cm.  The largest mass in the right lobe measures 12.3 x 10.0 cm, previously 11.6 x 9.8 cm.  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  His last CA 19-9 from early September was 438, previously 299 in July.  He was started on ipilimumab/nivolumab immunotherapy on September 28th.   INTERVAL HISTORY:  Madeline is here for follow up since experiencing dizziness with a fall last night. He states as he was walking down the stairs, he became light-headed and fell down the steps hitting his left side along the way including his head. He does think he had a slight loss of consciousness. Today he reports being very sore, mainly his left ankle, leg and chest. He also reports not being able to eat or drink well since Sunday of last week with today being Thursday. He is concerned that he may have injured himself and also feels dehydrated. We will plan for administration of IVF along with imaging of his left side as well as a head CT. He denies fever, chills, nausea or vomiting. He denies issue with bowel or bladder. He has had increasing shortness of breath as well as fatigue. CBC and CMP are unremarkable today.   REVIEW OF SYSTEMS:  Review of Systems  Constitutional: Positive for fatigue (severe). Negative for appetite change, chills, diaphoresis, fever and unexpected weight change.  HENT:   Negative for hearing loss, lump/mass, mouth sores, nosebleeds, sore throat, tinnitus, trouble  swallowing and voice change.   Eyes: Negative for eye problems and icterus.  Respiratory: Negative for chest tightness, cough, hemoptysis, shortness of breath and wheezing.   Cardiovascular: Negative for chest pain, leg swelling and palpitations.  Gastrointestinal: Negative for abdominal distention, abdominal pain, blood in stool, constipation, diarrhea, nausea, rectal pain and vomiting.  Endocrine: Negative for hot flashes.  Genitourinary: Negative for bladder incontinence, difficulty urinating, dyspareunia, dysuria, frequency, hematuria and nocturia.   Musculoskeletal: Positive for arthralgias, back pain, flank pain and myalgias. Negative for gait problem, neck pain and neck stiffness.  Skin: Negative for itching, rash and wound.  Neurological: Positive for dizziness, extremity weakness and light-headedness. Negative for gait problem, headaches, numbness, seizures and speech difficulty.  Hematological: Negative for adenopathy. Does not bruise/bleed easily.  Psychiatric/Behavioral: Negative for confusion, decreased concentration, depression, sleep disturbance and suicidal ideas. The patient is not nervous/anxious.   All other systems reviewed and are negative.    VITALS:  Blood pressure 114/66, pulse 89, temperature 98.1 F (36.7 C), resp. rate 16, height 5\' 10"  (1.778 m), SpO2  96 %.  Wt Readings from Last 3 Encounters:  07/13/20 180 lb (81.6 kg)  07/11/20 174 lb 5 oz (79.1 kg)  07/11/20 181 lb 12.8 oz (82.5 kg)    Body mass index is 25.83 kg/m.  Performance status (ECOG): 1 - Symptomatic but completely ambulatory  PHYSICAL EXAM:  Physical Exam Constitutional:      Appearance: Normal appearance. He is normal weight.  HENT:     Head: Normocephalic and atraumatic.  Eyes:     General: No scleral icterus.    Extraocular Movements: Extraocular movements intact.     Conjunctiva/sclera: Conjunctivae normal.     Pupils: Pupils are equal, round, and reactive to light.  Cardiovascular:       Rate and Rhythm: Normal rate and regular rhythm.     Pulses: Normal pulses.     Heart sounds: Normal heart sounds. No murmur heard.  No friction rub. No gallop.   Pulmonary:     Effort: Pulmonary effort is normal.     Breath sounds: Normal breath sounds.  Abdominal:     General: Bowel sounds are normal. There is no distension.     Palpations: Abdomen is soft. There is hepatomegaly (Liver measures 18 cm below the right costal marigin with an increased left lobe which is firm) and splenomegaly (mild). There is no mass.     Tenderness: There is no abdominal tenderness.  Musculoskeletal:        General: Normal range of motion.     Cervical back: Neck supple.     Right lower leg: No edema.     Left lower leg: Edema (mild) present.  Lymphadenopathy:     Cervical: No cervical adenopathy.  Skin:    General: Skin is warm and dry.  Neurological:     General: No focal deficit present.     Mental Status: He is alert and oriented to person, place, and time. Mental status is at baseline.    LABS:   STUDIES:  Exam(s): [id number] RAD/DG ANKLE COMPLETE 3+V-L CLINICAL DATA:  Pain following fall  EXAM: LEFT ANKLE COMPLETE - 3+ VIEW  COMPARISON:  None.  FINDINGS: Frontal, oblique, and lateral views were obtained. There is soft tissue swelling. No evident fracture or joint effusion. No appreciable joint space narrowing or erosion. Ankle mortise appears intact. There is a posterior calcaneal spur.  IMPRESSION: Soft tissue swelling. No acute fracture. No appreciable joint space narrowing. Posterior calcaneal spur present. Ankle mortise appears intact.   Electronically Signed   By: Lowella Grip III M.D.   On: 07/20/2020 13:52   Exam(s): [id number] RAD/DG CHEST 2V CLINICAL DATA:  Pain following fall  EXAM: CHEST - 2 VIEW  COMPARISON:  Chest CT October 08, 2019  FINDINGS: There is scarring and atelectasis in the lung bases. No edema or airspace opacity. Heart size and  pulmonary vascularity normal. No adenopathy. Port-A-Cath tip is in the superior vena cava. No pneumothorax. No bone lesions.  IMPRESSION: Scarring and atelectasis in the lung bases. No edema or airspace opacity. Heart size normal. No pneumothorax.   Electronically Signed   By: Lowella Grip III M.D.   On: 07/20/2020 13:56  Exam(s): [id number] RAD/DG FEMUR 2VL CLINICAL DATA:  Pain following fall  EXAM: LEFT FEMUR - 2 VIEW  COMPARISON:  None  FINDINGS: Frontal and lateral views were obtained. No fracture or dislocation. No abnormal periosteal reaction. There is mild narrowing of the left hip joint. No knee joint effusion. There are multiple foci of arterial vascular  calcification.  IMPRESSION: No fracture or dislocation. Mild narrowing left hip joint. Foci of atherosclerotic arterial vascular calcification noted.   Electronically Signed   By: Lowella Grip III M.D.   On: 07/20/2020 13:53  Exam(s): [id number] RAD/DG TIBIA/FIBULA 2V-L CLINICAL DATA:  Pain following fall  EXAM: LEFT TIBIA AND FIBULA - 2 VIEW  COMPARISON:  Left ankle radiographs July 20, 2020  FINDINGS: Frontal and lateral views obtained. No fracture or dislocation. No abnormal periosteal reaction. There is chondrocalcinosis in the knee joint. No erosion.  IMPRESSION: No fracture or dislocation. Chondrocalcinosis in the knee joint may be seen with osteoarthritis or calcium pyrophosphate deposition disease.   Electronically Signed   By: Lowella Grip III M.D.   On: 07/20/2020 13:53  Exam(s): [id number] CT/CT HEAD WO/W CM CLINICAL DATA:  Provided history: History of liver cancer diagnosed January 2021, recent fall, headache.  EXAM: CT HEAD WITHOUT AND WITH CONTRAST  TECHNIQUE: Contiguous axial images were obtained from the base of the skull through the vertex without and with intravenous contrast  CONTRAST:  100 mL Isovue 370 intravenous contrast.  COMPARISON:  Brain MRI  10/12/2019.  FINDINGS: Brain:  Hyperdense right hemispheric subdural hematoma measuring 7 mm in thickness. Minimal mass effect upon the underlying right cerebral hemisphere. No midline shift.  No demarcated cortical infarct is identified.  No evidence of intracranial metastatic disease.  Vascular: No hyperdense vessel is identified on precontrast imaging. Atherosclerotic calcifications. Expected vascular enhancement.  Skull: Normal. Negative for fracture or focal lesion.  Sinuses/Orbits: Visualized orbits show no acute finding. Mild mucosal thickening and scattered frothy secretions within the bilateral ethmoid air cells.  Other: Small right parietal scalp hematoma.  These results were called by telephone at the time of interpretation on 07/20/2020 at 2:48 pm to provider Lorry Furber NP, who verbally acknowledged these results.  IMPRESSION: Acute right hemispheric subdural hematoma measuring up to 7 mm in thickness. Minimal mass effect upon the underlying right cerebral hemisphere. No midline shift.  Small right parietal scalp hematoma.  No evidence of intracranial metastatic disease.  Ethmoid sinusitis.   Electronically Signed   By: Kellie Simmering DO   On: 07/20/2020 14:49   Allergies:  Allergies  Allergen Reactions  . Hydromorphone Itching  . Lortab [Hydrocodone-Acetaminophen] Itching    Current Medications: Current Outpatient Medications  Medication Sig Dispense Refill  . diphenoxylate-atropine (LOMOTIL) 2.5-0.025 MG tablet Take by mouth.    . furosemide (LASIX) 20 MG tablet Take 20 mg by mouth daily.    Marland Kitchen glimepiride (AMARYL) 2 MG tablet     . HYDROmorphone (DILAUDID) 2 MG tablet Take 2 mg by mouth 4 (four) times daily.    . hydrOXYzine (ATARAX/VISTARIL) 25 MG tablet Take 25 mg by mouth 3 (three) times daily as needed.    Marland Kitchen ibuprofen (ADVIL) 800 MG tablet     . lisinopril (ZESTRIL) 5 MG tablet Take 5 mg by mouth daily.    Marland Kitchen loperamide (IMODIUM) 2 MG  capsule Take by mouth as needed for diarrhea or loose stools.    . mirtazapine (REMERON) 30 MG tablet Take 30 mg by mouth at bedtime.    Marland Kitchen morphine (MS CONTIN) 15 MG 12 hr tablet Take 15 mg by mouth 2 (two) times daily as needed.    . Multiple Vitamin (MULTIVITAMIN) tablet Take 1 tablet by mouth daily.    Marland Kitchen omeprazole (PRILOSEC) 20 MG capsule Take 20 mg by mouth daily.    . ondansetron (ZOFRAN) 4 MG tablet Take 4 mg  by mouth every 8 (eight) hours as needed for nausea or vomiting.    . polyethylene glycol (MIRALAX / GLYCOLAX) 17 g packet Take 17 g by mouth daily.    . prochlorperazine (COMPAZINE) 10 MG tablet Take 10 mg by mouth every 6 (six) hours as needed for nausea or vomiting.    . sennosides-docusate sodium (SENOKOT-S) 8.6-50 MG tablet Take 1 tablet by mouth daily.     No current facility-administered medications for this visit.   Facility-Administered Medications Ordered in Other Visits  Medication Dose Route Frequency Provider Last Rate Last Admin  . heparin lock flush 100 unit/mL  500 Units Intracatheter Once PRN Dayton Scrape A, NP      . sodium chloride flush (NS) 0.9 % injection 10 mL  10 mL Intracatheter Once PRN Melodye Ped, NP         ASSESSMENT & PLAN:   Assessment:  1. Stage IIIA hepatocellular carcinoma, for which he was receiving palliative atezolizumab. However, imaging results revealed progressive disease, and was therefore stopped.  He began lenvatinib 12 mg daily on 02/10/2020.  This was decreased to 8 mg daily on 02/17/2020. He was unable to tolerate this even after holding the dose and then decreasing to 4mg  daily. Due to continued toxicities, Lenvima was discontinued.  We have now started him on ipilimumab/ nivolumab which he is tolerating fairly well.  2. Hemorrhage into the tumor, resolved.    3. Iron deficiency anemia, improved on IV iron.  He has not required IV iron since March.    4. Hyperbilirubinemia and elevation of the liver transaminases.   These have started to improve, but we will still need to hold his therapy for another week.  Most likely this is treatment induced.  5. 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  He has been instructed to stay off anticoagulant or aspirin.  6. History of hepatitis C, treated.  7. Chronic lower extremity swelling and pain, which is stable.  He continues hydrochlorothiazide.  8. Renal insufficiency, stable.    9.  Elevated liver function tests for the last 3 weeks, improving, so likely from his treatment.  I cannot rule out that this is related to his malignancy.  10. Recent fall with LOC. Pain to left side. Imaging negative for fractures; however, he is noted to have a 7 mm right subdural hematoma. We will observe as there is no intervention indicated.    Plan: We discussed for him to have close observation by his wife at home. They are to call with any changes especially mental status changes. He will use ice as necessary with elevation to his left ankle. He has pain medicines to relieve his pain. He understands it may take several days to recover from this fall. We will give 1 liter of normal saline today. He will return to clinic next week for repeat evaluation.   I provided 30 minutes (9:35 -10:04 AM) of face-to-face time during this this encounter and > 50% was spent counseling as documented under my assessment and plan.    Melodye Ped, NP [facility] [place]   .

## 2020-07-20 NOTE — Progress Notes (Signed)
Pt discharged in stable condition.

## 2020-07-24 ENCOUNTER — Other Ambulatory Visit: Payer: Medicare Other

## 2020-07-24 ENCOUNTER — Other Ambulatory Visit: Payer: Self-pay | Admitting: Hematology and Oncology

## 2020-07-24 ENCOUNTER — Ambulatory Visit: Payer: Medicare Other | Admitting: Hematology and Oncology

## 2020-07-24 DIAGNOSIS — C220 Liver cell carcinoma: Secondary | ICD-10-CM

## 2020-07-24 NOTE — Telephone Encounter (Signed)
Sent!

## 2020-07-25 ENCOUNTER — Other Ambulatory Visit: Payer: Self-pay | Admitting: Hematology and Oncology

## 2020-07-25 DIAGNOSIS — C220 Liver cell carcinoma: Secondary | ICD-10-CM

## 2020-07-26 ENCOUNTER — Other Ambulatory Visit: Payer: Self-pay | Admitting: Oncology

## 2020-07-26 DIAGNOSIS — C220 Liver cell carcinoma: Secondary | ICD-10-CM

## 2020-07-27 ENCOUNTER — Ambulatory Visit: Payer: Medicare Other | Admitting: Oncology

## 2020-07-27 ENCOUNTER — Other Ambulatory Visit: Payer: Medicare Other

## 2020-07-31 ENCOUNTER — Inpatient Hospital Stay: Payer: Medicare Other | Admitting: Hematology and Oncology

## 2020-07-31 ENCOUNTER — Telehealth: Payer: Self-pay | Admitting: Oncology

## 2020-07-31 ENCOUNTER — Other Ambulatory Visit: Payer: Self-pay

## 2020-07-31 ENCOUNTER — Encounter: Payer: Self-pay | Admitting: Hematology and Oncology

## 2020-07-31 ENCOUNTER — Inpatient Hospital Stay (INDEPENDENT_AMBULATORY_CARE_PROVIDER_SITE_OTHER): Payer: Medicare Other | Admitting: Hematology and Oncology

## 2020-07-31 ENCOUNTER — Encounter: Payer: Self-pay | Admitting: Oncology

## 2020-07-31 VITALS — BP 112/67 | HR 89 | Temp 98.5°F | Resp 16 | Ht 70.0 in | Wt 166.9 lb

## 2020-07-31 DIAGNOSIS — Z23 Encounter for immunization: Secondary | ICD-10-CM | POA: Diagnosis not present

## 2020-07-31 DIAGNOSIS — Z79899 Other long term (current) drug therapy: Secondary | ICD-10-CM | POA: Diagnosis not present

## 2020-07-31 DIAGNOSIS — Z0001 Encounter for general adult medical examination with abnormal findings: Secondary | ICD-10-CM | POA: Diagnosis not present

## 2020-07-31 DIAGNOSIS — C220 Liver cell carcinoma: Secondary | ICD-10-CM

## 2020-07-31 DIAGNOSIS — D649 Anemia, unspecified: Secondary | ICD-10-CM | POA: Diagnosis not present

## 2020-07-31 DIAGNOSIS — Z5112 Encounter for antineoplastic immunotherapy: Secondary | ICD-10-CM | POA: Diagnosis not present

## 2020-07-31 LAB — BASIC METABOLIC PANEL
BUN: 20 (ref 4–21)
CO2: 23 — AB (ref 13–22)
Chloride: 111 — AB (ref 99–108)
Creatinine: 1.9 — AB (ref 0.6–1.3)
Glucose: 99
Potassium: 4.2 (ref 3.4–5.3)
Sodium: 138 (ref 137–147)

## 2020-07-31 LAB — CBC AND DIFFERENTIAL
HCT: 32 — AB (ref 41–53)
Hemoglobin: 10.7 — AB (ref 13.5–17.5)
Platelets: 250 (ref 150–399)
WBC: 6.3

## 2020-07-31 LAB — CBC: RBC: 3.53 — AB (ref 3.87–5.11)

## 2020-07-31 LAB — COMPREHENSIVE METABOLIC PANEL
Albumin: 3.1 — AB (ref 3.5–5.0)
Calcium: 9.3 (ref 8.7–10.7)

## 2020-07-31 LAB — HEPATIC FUNCTION PANEL
ALT: 115 — AB (ref 10–40)
AST: 122 — AB (ref 14–40)
Alkaline Phosphatase: 466 — AB (ref 25–125)
Bilirubin, Total: 2.1

## 2020-07-31 LAB — TSH: TSH: 3.279 u[IU]/mL (ref 0.350–4.500)

## 2020-07-31 NOTE — Telephone Encounter (Signed)
Per 11/22 los appts given to patient.

## 2020-07-31 NOTE — Progress Notes (Signed)
[facility]  [address] [place],  [place]  [address] [phone]  Clinic Day:  07/31/2020  Referring physician: Cyndi Bender, PA-C   CHIEF COMPLAINT:  CC: Hepatocellular Carcinoma  Current Treatment:  Ipilimumab/nivolumab   HISTORY OF PRESENT ILLNESS:  The patient is a 64 year old with stage IIIA (T3 N0 M0) multifocal hepatocellular carcinoma diagnosed in February 2021.  He was referred by Cyndi Bender, PA-C in January due to abnormal liver function tests.  He has a history of hepatitis C, which was treated.  Due to abdominal pain and bloating, diarrhea, edema and right upper quadrant ultrasound was done in January.  This revealed at least 3 masses within the liver, the largest measured 10.2 x 9.3 x 9.5 cm.  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  The patient was started on rivaroxaban and then referred for consultation.  He had worsening anemia with a hemoglobin of 10.1.  B12 and folate were normal.  Iron studies were equivocal with a low serum iron of 34 and% saturation of 8.3% and high normal TIBC of 409, but ferritin was normal at 122.  A soluble transferrin was obtained and was very elevated, consistent with iron deficiency.  Alpha-fetoprotein was normal, but his CA 19-9 was elevated.  CT chest, abdomen, and pelvis in January confirmed the multiple hepatic masses, favored to represent multifocal hepatocellular carcinoma in the setting of cirrhosis.  The largest mass measured 9.8 x 7.5 cm in the high right hepatic lobe.  Perihepatic hemorrhage was centered about the left hepatic lobe, including a hematoma measuring 8.1 x 11.0 cm.  No acute process or evidence of metastatic disease was seen in the chest.  MRI head did not reveal any intracranial metastasis.  Core liver biopsy was done in February.  Pathology from this procedure revealed well to moderately  differentiated hepatocellular carcinoma, grade 1.  Due to the perihepatic hemorrhage, rivaroxaban was discontinued.  Due to the iron deficiency, he was advised start on oral iron supplement.  He had worsening hyperbilirubinemia in February prior to starting treatment, with his bilirubin up to 7.6, from 1.4.  Palliative treatment with single agent atezolizumab was recommended and started on February 25th.  This is usually given in combination with bevacizumab, but due to the hemorrhage within the tumor, we did not recommend bevacizumab.  His bilirubin went down to 5.7.  His 2nd cycle of palliative atezolizumab was delayed on March 18th due to worsening diarrhea, which he had for about 2 weeks.  Stool studies were negative.   He did not tolerate oral iron supplement, so we gave him IV iron in the form of Feraheme.  His CA 19-9 had increased from 246 in January to 358 in April, then down to 282 in early May.    CT chest, abdomen and pelvis from May 24th revealed the dominant right hepatic lesion is progressive in the interval, now measuring 11.6 x 9.8 cm, previously 9.8 x 7.5 cm.  The left hepatic mass remains poorly defined making reproducible comparative measurement difficult, but this lesion is similar to prior, now measuring 6.9 x 5.0 cm, previously 7.6 x 5.7 cm.  There is a progressive enhancing lesion in the inferior right liver measuring 2.9 cm.  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  Question of  IVC thrombus on the previous study is less evident.  With these results, we stopped his treatment.  The patient started Lenvima 12 mg daily on 02/10/2020.  On 02/17/2020, he called reporting severe diarrhea that was not improved with Imodium.  We decreased his dose due to toxicities and started Lomotil p.r.n..  He then required Lenvima to be held due to Grade 3 hepatotoxicity for a total of two weeks.  He  restarted Lenvima at 8 mg but continued to have severe toxicities of diarrhea and weight loss.  He was unable to tolerate even 4 mg of the Lenvima. The oxycodone was not sufficient to relieve his pain and he was changed to hydromorphone. Tramadol and hydrocodone did not provide sufficient relief.  He has not tolerated oxycodone, so he was placed on morphine 15 mg Q4 prn.  CT abdomen and pelvis from September 21st revealed multiple enlarging, bulky liver masses, and an additional new lesion, measuring 1.5 x 1.3 cm.  The largest mass in the right lobe measures 12.3 x 10.0 cm, previously 11.6 x 9.8 cm.  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  His last CA 19-9 from early September was 438, previously 299 in July.  He was started on ipilimumab/nivolumab immunotherapy on September 28th and completed 2 cycles.  He was seen on November 11th due to reporting dizziness with a fall the night prior. He states as he was walking down the stairs, became light-headed and fell down the steps hitting his left side along the way, including his head. He does think he had a slight loss of consciousness. He had pain in his left ankle, leg and chest.  He was concerned that he may have injured himself.  He also had not been eating or drinking well for at least 5 days, so felt dehydrated.  X-rays of the left ankle revealed soft tissue swelling, but no acute fracture.  Chest x-ray revealed scarring and atelectasis in the lung bases, but no acute cardiopulmonary or musculoskeletal abnormality.  CT head revealed an acute right hemispheric subdural hematoma measuring up to 7 mm in thickness, with minimal mass effect upon the underlying right cerebral hemisphere and no midline shift.  There was a small right parietal scalp hematoma.  No evidence of intracranial metastatic disease was seen.  He was discharged home under his wife's care and she was instructed to telephone if he had worsening symptoms.  He was  scheduled for follow-up in 1 week, but did not keep that appointment.  INTERVAL HISTORY:  Scott Farmer is here for prior to a 3rd cycle of ipilimumab/nivolumab and states he has been doing fairly well.  He denies any neurologic symptoms.  His appetite fluctuates up and down and he is trying to eat frequent small meals, as well as use nutritional supplements, but he has still lost 15 lb.  He states he has been drinking plenty of fluids.  He has occasional diarrhea, for which Imodium is effective.  He continues to have intermittent abdominal pain, which is occasionally severe.  Narcotic pain medication is effective for his pain.  His lower extremity edema had improved, but now has returned.  He requests a refill of furosemide. His wife accompanies him today and states that she was unable to get the Megace prescribed by [facility].  REVIEW OF SYSTEMS:  Review of Systems  Constitutional: Positive for appetite change, fatigue and unexpected weight change. Negative for chills and fever.  HENT:   Negative for lump/mass, mouth  sores, sore throat and trouble swallowing.   Respiratory: Positive for shortness of breath (intermittent, mild with exertion). Negative for cough.   Cardiovascular: Positive for leg swelling. Negative for chest pain and palpitations.  Gastrointestinal: Positive for diarrhea. Negative for abdominal pain, constipation, nausea and vomiting.  Genitourinary: Negative for difficulty urinating, dysuria, frequency and hematuria.   Musculoskeletal: Negative for arthralgias, back pain, gait problem and myalgias.  Skin: Positive for itching (lower back).  Neurological: Negative for dizziness, extremity weakness, gait problem, headaches, light-headedness, numbness, seizures and speech difficulty.  Psychiatric/Behavioral: Positive for depression (mild, on medication). The patient is nervous/anxious (mild, on medication).      VITALS:  Blood pressure 112/67, pulse 89, temperature 98.5 F (36.9 C),  temperature source Oral, resp. rate 16, height 5\' 10"  (1.778 m), weight 166 lb 14.4 oz (75.7 kg), SpO2 97 %.  Wt Readings from Last 3 Encounters:  07/31/20 166 lb 14.4 oz (75.7 kg)  07/13/20 180 lb (81.6 kg)  07/11/20 174 lb 5 oz (79.1 kg)    Body mass index is 23.95 kg/m.  Performance status (ECOG): 1 - Symptomatic but completely ambulatory  PHYSICAL EXAM:  Physical Exam Vitals and nursing note reviewed.  Constitutional:      General: He is not in acute distress. HENT:     Mouth/Throat:     Mouth: Mucous membranes are moist.     Pharynx: Oropharynx is clear.  Eyes:     General: Scleral icterus (mild) present.        Right eye: No discharge.        Left eye: No discharge.     Extraocular Movements: Extraocular movements intact.     Conjunctiva/sclera: Conjunctivae normal.  Cardiovascular:     Rate and Rhythm: Normal rate and regular rhythm.     Heart sounds: Normal heart sounds. No murmur heard.  No friction rub. No gallop.   Pulmonary:     Effort: No respiratory distress.     Breath sounds: Normal breath sounds. No stridor. No wheezing, rhonchi or rales.  Abdominal:     General: There is no distension.     Palpations: Abdomen is soft. There is hepatomegaly (liver remains enlarged at least 10 cm below the right costal margin extending to the midline and is firm and mildly tender). There is no splenomegaly or mass.     Hernia: No hernia is present.  Musculoskeletal:     Cervical back: Neck supple. No tenderness.     Right lower leg: Edema (1+) present.     Left lower leg: Edema (1+) present.  Skin:    General: Skin is warm.     Coloration: Skin is not jaundiced.     Findings: Rash (mild, slightly raised rash lower back) present.  Neurological:     General: No focal deficit present.     Mental Status: He is alert and oriented to person, place, and time.     Cranial Nerves: No cranial nerve deficit.     Sensory: No sensory deficit.     Motor: No weakness.      Coordination: Coordination normal.     Gait: Gait normal.  Psychiatric:        Mood and Affect: Mood normal.        Behavior: Behavior normal.   Lymph Nodes:  There is no palpable cervical, clavicular, axillary or inguinal lymphadenopathy.   LABS:  White blood count is 6.3 with a normal differential, hemoglobin 10.7 and platelets 250,000. A complete metabolic panel  was normal except for creatinine 1.9, albumin 3.1, bilirubin 2.1, AST 122, ALT 115 and akaline phosphatase 466.   STUDIES:  None   Allergies:  Allergies  Allergen Reactions  . Hydromorphone Itching  . Lortab [Hydrocodone-Acetaminophen] Itching    Current Medications: Current Outpatient Medications  Medication Sig Dispense Refill  . diphenoxylate-atropine (LOMOTIL) 2.5-0.025 MG tablet Take by mouth.    . furosemide (LASIX) 20 MG tablet Take 20 mg by mouth daily.    Marland Kitchen HYDROmorphone (DILAUDID) 2 MG tablet Take 2 mg by mouth 4 (four) times daily.    Marland Kitchen loperamide (IMODIUM) 2 MG capsule Take by mouth as needed for diarrhea or loose stools.    . mirtazapine (REMERON) 30 MG tablet Take 30 mg by mouth at bedtime.    Marland Kitchen morphine (MS CONTIN) 15 MG 12 hr tablet Take 15 mg by mouth 2 (two) times daily as needed.    Marland Kitchen omeprazole (PRILOSEC) 40 MG capsule Take 40 mg by mouth daily.    . ondansetron (ZOFRAN) 4 MG tablet Take 4 mg by mouth every 8 (eight) hours as needed for nausea or vomiting.    . polyethylene glycol (MIRALAX / GLYCOLAX) 17 g packet Take 17 g by mouth daily.    . prochlorperazine (COMPAZINE) 10 MG tablet Take 10 mg by mouth every 6 (six) hours as needed for nausea or vomiting.    . sennosides-docusate sodium (SENOKOT-S) 8.6-50 MG tablet Take 1 tablet by mouth daily.     No current facility-administered medications for this visit.     ASSESSMENT & PLAN:   Assessment:  1. Stage IIIA hepatocellular carcinoma, for which he was receiving palliative atezolizumab. However, imaging results revealed progressive disease,  and was therefore stopped.  He began lenvatinib 12 mg daily in June.  This was decreased to 8 mg daily within his first week of treatment. Lenvatinib was then held ad later resumed at 4mg  daily. Due to continued toxicities despite dose reduction, lenvatinib was discontinued.  He is now receiving palliative ipilimumab/ nivolumab, which he has tolerated fairly well. He will proceed with a 3rd cycle this week. 2. Hemorrhage into the tumor, resolved.   3. Iron deficiency anemia, improved on IV iron.  He has not required IV iron since March.   4. Hyperbilirubinemia and elevation of the liver transaminases.  These have started to improve, but we will still need to hold his therapy for another week.  Most likely this is treatment induced. 5. 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  He has been instructed to stay off anticoagulant or aspirin. 6. History of hepatitis C, treated. 7. Chronic lower extremity swelling and pain, which is stable.  He continues hydrochlorothiazide.  We do not recommend resuming furosemide due to the worsening renal function. 8. Chronic kidney disease, which is worsened.    He has been drinking plenty of fluids and does not appear dehydrated. 9. Elevated transaminases , which are stable.  This is likely from his treatment, although we cannot rule out that this is related to his malignancy. 10. Recent fall with LOC. Pain to left side. Imaging negative for fractures. He was instructed to use ice to the left ankle and pain medication as needed.  His left ankle pain is improving. 11. Small, 7 mm right subdural hematoma. He was discharged home to have close observation by his wife.  He was instructed to return in 1 week for continued observation, which he canceled. He has no neurologic signs or symptoms today. 12. Weight loss, which  is significant.  This is likely due to his disease.  I encouraged him to increase the nutritional supplements to avoid further weight loss.  Due to his  previous history of portal vein thrombosis, Dr. Hinton Rao decided against the Megace.  As he is otherwise stable, we will plan to proceed with his 3rd cycle of ipilimumab/nivolumab this week.  Plan:    He will proceed with a 3rd cycle of ipilimumab /nivolumab on November 24th.  The patient and his wife understand the plans discussed today and are in agreement with them.  They know to contact our office if he develops issues requiring immediate clinical assessment.    Marvia Pickles, PA-C [facility]   .

## 2020-08-01 ENCOUNTER — Other Ambulatory Visit: Payer: Self-pay | Admitting: Pharmacist

## 2020-08-01 LAB — T4: T4, Total: 14.6 ug/dL — ABNORMAL HIGH (ref 4.5–12.0)

## 2020-08-02 ENCOUNTER — Other Ambulatory Visit: Payer: Self-pay

## 2020-08-02 ENCOUNTER — Inpatient Hospital Stay: Payer: Medicare Other

## 2020-08-02 VITALS — BP 122/67 | HR 90 | Temp 98.7°F | Resp 18 | Ht 70.0 in | Wt 164.6 lb

## 2020-08-02 DIAGNOSIS — Z79899 Other long term (current) drug therapy: Secondary | ICD-10-CM | POA: Diagnosis not present

## 2020-08-02 DIAGNOSIS — C220 Liver cell carcinoma: Secondary | ICD-10-CM | POA: Diagnosis not present

## 2020-08-02 DIAGNOSIS — Z5112 Encounter for antineoplastic immunotherapy: Secondary | ICD-10-CM | POA: Diagnosis not present

## 2020-08-02 DIAGNOSIS — Z23 Encounter for immunization: Secondary | ICD-10-CM | POA: Diagnosis not present

## 2020-08-02 MED ORDER — SODIUM CHLORIDE 0.9 % IV SOLN
3.0000 mg/kg | Freq: Once | INTRAVENOUS | Status: AC
  Administered 2020-08-02: 250 mg via INTRAVENOUS
  Filled 2020-08-02: qty 10

## 2020-08-02 MED ORDER — SODIUM CHLORIDE 0.9 % IV SOLN
1.0000 mg/kg | Freq: Once | INTRAVENOUS | Status: AC
  Administered 2020-08-02: 78 mg via INTRAVENOUS
  Filled 2020-08-02: qty 7.8

## 2020-08-02 MED ORDER — SODIUM CHLORIDE 0.9 % IV SOLN
Freq: Once | INTRAVENOUS | Status: AC
  Filled 2020-08-02: qty 250

## 2020-08-02 MED ORDER — HEPARIN SOD (PORK) LOCK FLUSH 100 UNIT/ML IV SOLN
500.0000 [IU] | Freq: Once | INTRAVENOUS | Status: AC | PRN
  Administered 2020-08-02: 500 [IU]
  Filled 2020-08-02: qty 5

## 2020-08-02 NOTE — Progress Notes (Signed)
PT STABLE AT TIME OF DISCHARGE 

## 2020-08-02 NOTE — Progress Notes (Signed)
Called and spoke with [facility] Transportation and scheduled patient for 12/13 going to [facility] location and then back home. Also scheduled patient for transportation on 12/15 going to [facility] location and then back home.

## 2020-08-02 NOTE — Patient Instructions (Signed)
yervoy Ipilimumab injection What is this medicine? IPILIMUMAB (IP i LIM ue mab) is a monoclonal antibody. It is used to treat colorectal cancer, kidney cancer, liver cancer, lung cancer, melanoma, and mesothelioma. This medicine may be used for other purposes; ask your health care provider or pharmacist if you have questions. COMMON BRAND NAME(S): YERVOY What should I tell my health care provider before I take this medicine? They need to know if you have any of these conditions:  Addison's disease  blood in your stools (black or tarry stools) or if you have blood in your vomit  eye disease, vision problems  history of pancreatitis  history of stomach bleeding  immune system problems  inflammatory bowel disease  kidney disease  liver disease  lupus  myasthenia gravis  organ transplant  rheumatoid arthritis  sarcoidosis  stomach or intestine problems  thyroid disease  tingling of the fingers or toes, or other nerve disorder  an unusual or allergic reaction to ipilimumab, other medicines, foods, dyes, or preservatives  pregnant or trying to get pregnant  breast-feeding How should I use this medicine? This medicine is for infusion into a vein. It is given by a health care professional in a hospital or clinic setting. A special MedGuide will be given to you before each treatment. Be sure to read this information carefully each time. Talk to your pediatrician regarding the use of this medicine in children. While this drug may be prescribed for children as young as 12 years for selected conditions, precautions do apply. Overdosage: If you think you have taken too much of this medicine contact a poison control center or emergency room at once. NOTE: This medicine is only for you. Do not share this medicine with others. What if I miss a dose? It is important not to miss your dose. Call your doctor or health care professional if you are unable to keep an  appointment. What may interact with this medicine? Interactions are not expected. This list may not describe all possible interactions. Give your health care provider a list of all the medicines, herbs, non-prescription drugs, or dietary supplements you use. Also tell them if you smoke, drink alcohol, or use illegal drugs. Some items may interact with your medicine. What should I watch for while using this medicine? Tell your doctor or healthcare professional if your symptoms do not start to get better or if they get worse. Do not become pregnant while taking this medicine or for 3 months after stopping it. Women should inform their doctor if they wish to become pregnant or think they might be pregnant. There is a potential for serious side effects to an unborn child. Talk to your health care professional or pharmacist for more information. Do not breast-feed an infant while taking this medicine or for 3 months after the last dose. Your condition will be monitored carefully while you are receiving this medicine. You may need blood work done while you are taking this medicine. What side effects may I notice from receiving this medicine? Side effects that you should report to your doctor or health care professional as soon as possible:  allergic reactions like skin rash, itching or hives, swelling of the face, lips, or tongue  black, tarry stools  bloody or watery diarrhea  changes in vision  dizziness  eye pain  fast, irregular heartbeat  feeling anxious  feeling faint or lightheaded, falls  nausea, vomiting  pain, tingling, numbness in the hands or feet  redness, blistering, peeling or  loosening of the skin, including inside the mouth  signs and symptoms of liver injury like dark yellow or brown urine; general ill feeling or flu-like symptoms; light-colored stools; loss of appetite; nausea; right upper belly pain; unusually weak or tired; yellowing of the eyes or skin  unusual  bleeding or bruising Side effects that usually do not require medical attention (report to your doctor or health care professional if they continue or are bothersome):  headache  loss of appetite  trouble sleeping This list may not describe all possible side effects. Call your doctor for medical advice about side effects. You may report side effects to FDA at 1-800-FDA-1088. Where should I keep my medicine? This drug is given in a hospital or clinic and will not be stored at home. NOTE: This sheet is a summary. It may not cover all possible information. If you have questions about this medicine, talk to your doctor, pharmacist, or health care provider.  2020 Elsevier/Gold Standard (2019-06-15 10:56:55) Nivolumab injection What is this medicine? NIVOLUMAB (nye VOL ue mab) is a monoclonal antibody. It is used to treat colon cancer, esophageal cancer, head and neck cancer, Hodgkin lymphoma, kidney cancer, liver cancer, lung cancer, mesothelioma, melanoma, and urothelial cancer. This medicine may be used for other purposes; ask your health care provider or pharmacist if you have questions. COMMON BRAND NAME(S): Opdivo What should I tell my health care provider before I take this medicine? They need to know if you have any of these conditions:  diabetes  immune system problems  kidney disease  liver disease  lung disease  organ transplant  stomach or intestine problems  thyroid disease  an unusual or allergic reaction to nivolumab, other medicines, foods, dyes, or preservatives  pregnant or trying to get pregnant  breast-feeding How should I use this medicine? This medicine is for infusion into a vein. It is given by a health care professional in a hospital or clinic setting. A special MedGuide will be given to you before each treatment. Be sure to read this information carefully each time. Talk to your pediatrician regarding the use of this medicine in children. While this  drug may be prescribed for children as young as 12 years for selected conditions, precautions do apply. Overdosage: If you think you have taken too much of this medicine contact a poison control center or emergency room at once. NOTE: This medicine is only for you. Do not share this medicine with others. What if I miss a dose? It is important not to miss your dose. Call your doctor or health care professional if you are unable to keep an appointment. What may interact with this medicine? Interactions have not been studied. Give your health care provider a list of all the medicines, herbs, non-prescription drugs, or dietary supplements you use. Also tell them if you smoke, drink alcohol, or use illegal drugs. Some items may interact with your medicine. This list may not describe all possible interactions. Give your health care provider a list of all the medicines, herbs, non-prescription drugs, or dietary supplements you use. Also tell them if you smoke, drink alcohol, or use illegal drugs. Some items may interact with your medicine. What should I watch for while using this medicine? This drug may make you feel generally unwell. Continue your course of treatment even though you feel ill unless your doctor tells you to stop. You may need blood work done while you are taking this medicine. Do not become pregnant while taking this medicine  or for 5 months after stopping it. Women should inform their doctor if they wish to become pregnant or think they might be pregnant. There is a potential for serious side effects to an unborn child. Talk to your health care professional or pharmacist for more information. Do not breast-feed an infant while taking this medicine or for 5 months after stopping it. What side effects may I notice from receiving this medicine? Side effects that you should report to your doctor or health care professional as soon as possible:  allergic reactions like skin rash, itching or  hives, swelling of the face, lips, or tongue  breathing problems  blood in the urine  bloody or watery diarrhea or black, tarry stools  changes in emotions or moods  changes in vision  chest pain  cough  dizziness  feeling faint or lightheaded, falls  fever, chills  headache with fever, neck stiffness, confusion, loss of memory, sensitivity to light, hallucination, loss of contact with reality, or seizures  joint pain  mouth sores  redness, blistering, peeling or loosening of the skin, including inside the mouth  severe muscle pain or weakness  signs and symptoms of high blood sugar such as dizziness; dry mouth; dry skin; fruity breath; nausea; stomach pain; increased hunger or thirst; increased urination  signs and symptoms of kidney injury like trouble passing urine or change in the amount of urine  signs and symptoms of liver injury like dark yellow or brown urine; general ill feeling or flu-like symptoms; light-colored stools; loss of appetite; nausea; right upper belly pain; unusually weak or tired; yellowing of the eyes or skin  swelling of the ankles, feet, hands  trouble passing urine or change in the amount of urine  unusually weak or tired  weight gain or loss Side effects that usually do not require medical attention (report to your doctor or health care professional if they continue or are bothersome):  bone pain  constipation  decreased appetite  diarrhea  muscle pain  nausea, vomiting  tiredness This list may not describe all possible side effects. Call your doctor for medical advice about side effects. You may report side effects to FDA at 1-800-FDA-1088. Where should I keep my medicine? This drug is given in a hospital or clinic and will not be stored at home. NOTE: This sheet is a summary. It may not cover all possible information. If you have questions about this medicine, talk to your doctor, pharmacist, or health care provider.   2020 Elsevier/Gold Standard (2019-06-15 10:04:50)

## 2020-08-08 ENCOUNTER — Ambulatory Visit: Payer: Medicare Other

## 2020-08-18 NOTE — Progress Notes (Signed)
[facility]  [address] [place],  [place]  [address] [phone]  Clinic Day:  08/21/2020  Referring physician: Cyndi Bender, PA-C   CHIEF COMPLAINT:  CC: Hepatocellular Carcinoma  Current Treatment:  Ipilimumab/nivolumab   HISTORY OF PRESENT ILLNESS:  The patient is a 64 year old with stage IIIA (T3 N0 M0) multifocal hepatocellular carcinoma diagnosed in February 2021.  He was referred by Cyndi Bender, PA-C in January due to abnormal liver function tests.  He has a history of hepatitis C, which was treated.  Due to abdominal pain and bloating, diarrhea, edema and right upper quadrant ultrasound was done in January.  This revealed at least 3 masses within the liver, the largest measured 10.2 x 9.3 x 9.5 cm.  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  The patient was started on rivaroxaban and then referred for consultation.  He had worsening anemia with a hemoglobin of 10.1.  B12 and folate were normal.  Iron studies were equivocal with a low serum iron of 34 and% saturation of 8.3% and high normal TIBC of 409, but ferritin was normal at 122.  A soluble transferrin was obtained and was very elevated, consistent with iron deficiency.  Alpha-fetoprotein was normal, but his CA 19-9 was elevated.  CT chest, abdomen, and pelvis in January confirmed the multiple hepatic masses, favored to represent multifocal hepatocellular carcinoma in the setting of cirrhosis.  The largest mass measured 9.8 x 7.5 cm in the high right hepatic lobe.  Perihepatic hemorrhage was centered about the left hepatic lobe, including a hematoma measuring 8.1 x 11.0 cm.  No acute process or evidence of metastatic disease was seen in the chest.  MRI head did not reveal any intracranial metastasis.  Core liver biopsy was done in February.  Pathology from this procedure revealed well to moderately  differentiated hepatocellular carcinoma, grade 1.  Due to the perihepatic hemorrhage, rivaroxaban was discontinued.  Due to the iron deficiency, he was advised start on oral iron supplement.  He had worsening hyperbilirubinemia in February prior to starting treatment, with his bilirubin up to 7.6, from 1.4.  Palliative treatment with single agent atezolizumab was recommended and started on February 25th.  This is usually given in combination with bevacizumab, but due to the hemorrhage within the tumor, we did not recommend bevacizumab.  His bilirubin went down to 5.7.  His 2nd cycle of palliative atezolizumab was delayed on March 18th due to worsening diarrhea, which he had for about 2 weeks.  Stool studies were negative.   He did not tolerate oral iron supplement, so we gave him IV iron in the form of Feraheme.  His CA 19-9 had increased from 246 in January to 358 in April, then down to 282 in early May.    CT chest, abdomen and pelvis from May 24th revealed the dominant right hepatic lesion is progressive in the interval, now measuring 11.6 x 9.8 cm, previously 9.8 x 7.5 cm.  The left hepatic mass remains poorly defined making reproducible comparative measurement difficult, but this lesion is similar to prior, now measuring 6.9 x 5.0 cm, previously 7.6 x 5.7 cm.  There is a progressive enhancing lesion in the inferior right liver measuring 2.9 cm.  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  Question of  IVC thrombus on the previous study is less evident.  With these results, we stopped his treatment.  The patient started Lenvima 12 mg daily on 02/10/2020.  On 02/17/2020, he called reporting severe diarrhea that was not improved with Imodium.  We decreased his dose due to toxicities and started Lomotil p.r.n..  He then required Lenvima to be held due to Grade 3 hepatotoxicity for a total of two weeks.  He  restarted Lenvima at 8 mg but continued to have severe toxicities of diarrhea and weight loss.  He was unable to tolerate even 4 mg of the Lenvima. The oxycodone was not sufficient to relieve his pain and he was changed to hydromorphone. Tramadol and hydrocodone did not provide sufficient relief.  He has not tolerated oxycodone, so he was placed on morphine 15 mg Q4 prn.  CT abdomen and pelvis from September 21st revealed multiple enlarging, bulky liver masses, and an additional new lesion, measuring 1.5 x 1.3 cm.  The largest mass in the right lobe measures 12.3 x 10.0 cm, previously 11.6 x 9.8 cm.  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  His last CA 19-9 from early September was 438, previously 299 in July.  He was started on ipilimumab/nivolumab immunotherapy on September 28th and completed 2 cycles.  He was seen on November 11th due to reporting dizziness with a fall the night prior.  CT head revealed an acute right hemispheric subdural hematoma measuring up to 7 mm in thickness, with minimal mass effect upon the underlying right cerebral hemisphere and no midline shift.  There was a small right parietal scalp hematoma.  No evidence of intracranial metastatic disease was seen.  He was discharged home under his wife's care and she was instructed to telephone if he had worsening symptoms.  He had lost a great deal of weight, but we proceeded with his 3rd cycle.    INTERVAL HISTORY:  He is here for follow up prior to a 4th cycle of ipilimumab/nivolumab.  He notes worsening bilateral lower extremity edema, left worse than right with pain of the left leg and especially the thigh.  We will evaluate with Doppler ultrasound today.  He also notes acute productive cough with clear sputum, which he feels may be a cold.  He denies chest pain, hemoptysis and shortness of breath.  He continues to have insomnia despite Remeron 30 mg at bedtime.  We will therefore add in lorazepam 1 mg  at bedtime.  He continues MS Contin 15 mg BID and averaging two Dilaudid 2 mg daily.  His hemoglobin has decreased from 10.7 to 9.9, and his white count and platelets are normal.  Chemistries are remarkable for a creatinine of 1.5, improved from 1.9, a total bilirubin of 1.5, improved, and elevated liver transaminases, also improved.  His  appetite is good, and he has gained 18 pounds since his last visit.  He denies fever, chills or other signs of infection.  He denies nausea, vomiting, bowel issues, or abdominal pain.  He denies sore throat, dyspnea, or chest pain.   REVIEW OF SYSTEMS:  Review of Systems  Constitutional: Positive for fatigue.       Cold intolerance  HENT:  Negative.   Eyes: Negative.   Respiratory: Positive for cough (productive with clear sputum). Negative for hemoptysis, shortness of breath and wheezing.   Cardiovascular: Positive for leg swelling (left worse than right).  Gastrointestinal: Negative.   Endocrine: Negative.   Genitourinary: Negative.  Musculoskeletal: Positive for gait problem (unsteadiness).  Skin: Negative.   Neurological: Positive for gait problem (unsteadiness).  Hematological: Negative.   Psychiatric/Behavioral: Negative.   All other systems reviewed and are negative.    VITALS:  Blood pressure 123/65, pulse 83, temperature 97.9 F (36.6 C), resp. rate 16, height 5\' 10"  (1.778 m), weight 182 lb 8 oz (82.8 kg), SpO2 98 %.  Wt Readings from Last 3 Encounters:  08/21/20 182 lb 8 oz (82.8 kg)  08/02/20 164 lb 10 oz (74.7 kg)  07/31/20 166 lb 14.4 oz (75.7 kg)    Body mass index is 26.19 kg/m.  Performance status (ECOG): 1 - Symptomatic but completely ambulatory  PHYSICAL EXAM:  Physical Exam Constitutional:      General: He is not in acute distress.    Appearance: Normal appearance. He is normal weight.  HENT:     Head: Normocephalic and atraumatic.  Eyes:     General: No scleral icterus.    Extraocular Movements: Extraocular  movements intact.     Conjunctiva/sclera: Conjunctivae normal.     Pupils: Pupils are equal, round, and reactive to light.  Cardiovascular:     Rate and Rhythm: Normal rate and regular rhythm.     Pulses: Normal pulses.     Heart sounds: Normal heart sounds. No murmur heard. No friction rub. No gallop.   Pulmonary:     Effort: Pulmonary effort is normal. No respiratory distress.     Breath sounds: Normal breath sounds.  Abdominal:     General: Bowel sounds are normal. There is no distension.     Palpations: Abdomen is soft. There is hepatomegaly (8-9 cm below the right costal margin, with an elarged left lobe). There is no mass.     Tenderness: There is no abdominal tenderness.  Musculoskeletal:        General: Normal range of motion.     Cervical back: Normal range of motion and neck supple.     Right lower leg: 2+ Edema present.     Left lower leg: 2+ Edema (with tenderness of the left calf and left lateral inner thigh) present.  Lymphadenopathy:     Cervical: No cervical adenopathy.  Skin:    General: Skin is warm and dry.  Neurological:     General: No focal deficit present.     Mental Status: He is alert and oriented to person, place, and time. Mental status is at baseline.  Psychiatric:        Mood and Affect: Mood normal.        Behavior: Behavior normal.        Thought Content: Thought content normal.        Judgment: Judgment normal.   Lymph Nodes:  There is no palpable cervical, clavicular, axillary or inguinal lymphadenopathy.   LABS:   His hemoglobin has decreased from 10.7 to 9.9, and his white count and platelets are normal.  Chemistries are remarkable for a creatinine of 1.5, improved from 1.9, a total bilirubin of 1.5, improved, and elevated liver transaminases, also improved.  STUDIES:  None   Allergies:  Allergies  Allergen Reactions  . Hydromorphone Itching  . Lortab [Hydrocodone-Acetaminophen] Itching    Current Medications: Current Outpatient  Medications  Medication Sig Dispense Refill  . diphenoxylate-atropine (LOMOTIL) 2.5-0.025 MG tablet Take by mouth.    . furosemide (LASIX) 20 MG tablet Take 20 mg by mouth daily.    Marland Kitchen HYDROmorphone (DILAUDID) 2 MG tablet Take 2 mg by mouth 4 (  four) times daily.    Marland Kitchen loperamide (IMODIUM) 2 MG capsule Take by mouth as needed for diarrhea or loose stools.    . mirtazapine (REMERON) 30 MG tablet Take 30 mg by mouth at bedtime.    Marland Kitchen morphine (MS CONTIN) 15 MG 12 hr tablet Take 15 mg by mouth 2 (two) times daily as needed.    Marland Kitchen omeprazole (PRILOSEC) 40 MG capsule Take 40 mg by mouth daily.    . ondansetron (ZOFRAN) 4 MG tablet Take 4 mg by mouth every 8 (eight) hours as needed for nausea or vomiting.    . polyethylene glycol (MIRALAX / GLYCOLAX) 17 g packet Take 17 g by mouth daily.    . prochlorperazine (COMPAZINE) 10 MG tablet Take 10 mg by mouth every 6 (six) hours as needed for nausea or vomiting.    . sennosides-docusate sodium (SENOKOT-S) 8.6-50 MG tablet Take 1 tablet by mouth daily.     No current facility-administered medications for this visit.     ASSESSMENT & PLAN:   Assessment:  1. Stage IIIA hepatocellular carcinoma, for which he was receiving palliative atezolizumab. However, imaging results revealed progressive disease, and was therefore stopped.  He began lenvatinib 12 mg daily in June, but was unable to tolerate that even at lower doses.  He is now receiving palliative ipilimumab/ nivolumab, which he has tolerated fairly well. He will proceed with a 4th cycle this week.  2. Hemorrhage into the tumor, resolved.    3. Iron deficiency anemia, improved on IV iron.  He has not required IV iron since March.    4. Hyperbilirubinemia and elevation of the liver transaminases.  These have started to improve.  Most likely this is treatment induced.  5. 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  He has been instructed to stay off anticoagulant or aspirin.  6. History of hepatitis C,  treated.  7. Chronic lower extremity swelling and pain, which has worsened, left greater than right.  He continues hydrochlorothiazide.  We do not recommend resuming furosemide due to the worsening renal function.  As he is also having pain of the left leg and thigh, we will pursue Doppler ultrasound for further evaluation today.  Unfortunately, if he requires anticoagulation, this will be quite risky in view of recent subdural hematoma, but he has been clinically stable.  He has had prior hemorrhage into his tumor, but also thrombosis of the distal main portal and other veins.    8. Chronic kidney disease, improved.    He has been drinking plenty of fluids and does not appear dehydrated.  9. Elevated transaminases , which have improved.  This is likely from his treatment, although we cannot rule out that this is related to his malignancy.  10. Small, 7 mm right subdural hematoma after a fall in November. He was discharged home to have close observation by his wife.  He was instructed to return in 1 week for continued observation, which he canceled. He has no neurologic signs or symptoms today.  Plan:     With his current lower extremity symptoms of worsening edema, and pain of the left leg, we will order Doppler ultrasound to evaluate for possible thrombus.  It is a dilema as he has had both bleeding and clotting complications, such as hemorrhage into his hepatic tumor, subdural hematoma, portal vein thrombosis and now suspected deep venous thrombosis.   He continues to have insomnia despite Remeron 30 mg at bedtime.  We will therefore add in lorazepam 1 mg  at bedtime.  I will add a CA 19-9 to his labs today.  He will proceed with a 4th cycle of ipilimumab/nivolumab on December 15th.  We will see him back in 3 weeks with CBC, CMP and CA 19-9 prior to his 1st cycle of maintenance nivolumab.  Following that visit, we will go to appointments and treatment every 4 weeks.  We will plan on repeat imaging at  the end of January.  I gave him a prescription for a walker today.  The patient and his wife understand the plans discussed today and are in agreement with them.  They know to contact our office if he develops issues requiring immediate clinical assessment.    Derwood Kaplan, MD [facility] [place]    I, Rita Ohara, am acting as scribe for Derwood Kaplan, MD  I have reviewed this report as typed by the medical scribe, and it is complete and accurate.

## 2020-08-21 ENCOUNTER — Other Ambulatory Visit: Payer: Self-pay | Admitting: Hematology and Oncology

## 2020-08-21 ENCOUNTER — Telehealth: Payer: Self-pay | Admitting: Oncology

## 2020-08-21 ENCOUNTER — Other Ambulatory Visit: Payer: Self-pay

## 2020-08-21 ENCOUNTER — Encounter: Payer: Self-pay | Admitting: Oncology

## 2020-08-21 ENCOUNTER — Inpatient Hospital Stay: Payer: Medicare Other

## 2020-08-21 ENCOUNTER — Other Ambulatory Visit: Payer: Self-pay | Admitting: Oncology

## 2020-08-21 ENCOUNTER — Inpatient Hospital Stay: Payer: Medicare Other | Attending: Oncology | Admitting: Oncology

## 2020-08-21 DIAGNOSIS — C220 Liver cell carcinoma: Secondary | ICD-10-CM | POA: Insufficient documentation

## 2020-08-21 DIAGNOSIS — D689 Coagulation defect, unspecified: Secondary | ICD-10-CM

## 2020-08-21 DIAGNOSIS — Z79899 Other long term (current) drug therapy: Secondary | ICD-10-CM | POA: Insufficient documentation

## 2020-08-21 DIAGNOSIS — D509 Iron deficiency anemia, unspecified: Secondary | ICD-10-CM | POA: Diagnosis not present

## 2020-08-21 DIAGNOSIS — I81 Portal vein thrombosis: Secondary | ICD-10-CM

## 2020-08-21 DIAGNOSIS — Z5112 Encounter for antineoplastic immunotherapy: Secondary | ICD-10-CM | POA: Insufficient documentation

## 2020-08-21 DIAGNOSIS — M7989 Other specified soft tissue disorders: Secondary | ICD-10-CM | POA: Diagnosis not present

## 2020-08-21 DIAGNOSIS — R2242 Localized swelling, mass and lump, left lower limb: Secondary | ICD-10-CM | POA: Diagnosis not present

## 2020-08-21 DIAGNOSIS — R600 Localized edema: Secondary | ICD-10-CM

## 2020-08-21 DIAGNOSIS — M79662 Pain in left lower leg: Secondary | ICD-10-CM | POA: Diagnosis not present

## 2020-08-21 DIAGNOSIS — M79605 Pain in left leg: Secondary | ICD-10-CM | POA: Diagnosis not present

## 2020-08-21 LAB — COMPREHENSIVE METABOLIC PANEL
Albumin: 3 — AB (ref 3.5–5.0)
Calcium: 9.1 (ref 8.7–10.7)

## 2020-08-21 LAB — BASIC METABOLIC PANEL
BUN: 16 (ref 4–21)
CO2: 20 (ref 13–22)
Chloride: 111 — AB (ref 99–108)
Creatinine: 1.5 — AB (ref 0.6–1.3)
Glucose: 141
Potassium: 4 (ref 3.4–5.3)
Sodium: 138 (ref 137–147)

## 2020-08-21 LAB — CBC AND DIFFERENTIAL
HCT: 30 — AB (ref 41–53)
Hemoglobin: 9.9 — AB (ref 13.5–17.5)
Platelets: 276 (ref 150–399)
WBC: 5.7

## 2020-08-21 LAB — HEPATIC FUNCTION PANEL
ALT: 83 — AB (ref 10–40)
AST: 106 — AB (ref 14–40)
Alkaline Phosphatase: 458 — AB (ref 25–125)
Bilirubin, Total: 1.5

## 2020-08-21 LAB — TSH: TSH: 2.457 u[IU]/mL (ref 0.350–4.500)

## 2020-08-21 NOTE — Telephone Encounter (Signed)
Per 12/13 los next appt sched and given to patient.

## 2020-08-21 NOTE — Progress Notes (Signed)
Patient needs to get a procedure done STAT, he is scheduled to be picked up from [facility] soon. I called and spoke with them and they were able to change pickup time to 3:30pm.

## 2020-08-22 ENCOUNTER — Other Ambulatory Visit: Payer: Self-pay | Admitting: Hematology and Oncology

## 2020-08-22 ENCOUNTER — Telehealth: Payer: Self-pay

## 2020-08-22 LAB — T4: T4, Total: 16.5 ug/dL — ABNORMAL HIGH (ref 4.5–12.0)

## 2020-08-22 MED ORDER — LORAZEPAM 1 MG PO TABS: 1.0000 mg | ORAL_TABLET | Freq: Three times a day (TID) | ORAL | 0 refills | Status: DC | PRN

## 2020-08-22 NOTE — Telephone Encounter (Addendum)
I spoke with [name] this morning (08/23/2020) and notified her of the info below, & she voiced understanding.  Attempted call to pt's wife, no answer.  ----- Message from Scott Kaplan, Scott Farmer sent at 08/22/2020  3:01 PM EST ----- Regarding: RE: Left leg and thigh "burning like fire" [name] will send in the lorazepam, I thought we had.Marland KitchenMarland KitchenThe U/S was neg for DVT so think this is superficial thrombophlebitis, rec ice and elevation, use pain meds as needed ----- Message ----- From: Dairl Ponder, RN Sent: 08/22/2020   9:47 AM EST To: Scott Kaplan, Scott Farmer Subject: Left leg and thigh "burning like fire"         -Pt's wife called to report that "[name]'s left leg and thigh are burning like fire" She also wanted to ask if the medciation had been sent in (?lorazepam) [id number]  I called [name] back to find out what he had taken this morning for pain. She staes he had the long acting morphine and one of the other Morphine he takes every 4 hours.

## 2020-08-23 ENCOUNTER — Other Ambulatory Visit: Payer: Self-pay

## 2020-08-23 ENCOUNTER — Other Ambulatory Visit: Payer: Self-pay | Admitting: Hematology and Oncology

## 2020-08-23 ENCOUNTER — Inpatient Hospital Stay: Payer: Medicare Other

## 2020-08-23 VITALS — BP 117/66 | HR 91 | Temp 98.3°F | Resp 18 | Ht 70.0 in | Wt 182.8 lb

## 2020-08-23 DIAGNOSIS — Z5112 Encounter for antineoplastic immunotherapy: Secondary | ICD-10-CM | POA: Diagnosis not present

## 2020-08-23 DIAGNOSIS — C220 Liver cell carcinoma: Secondary | ICD-10-CM | POA: Diagnosis not present

## 2020-08-23 DIAGNOSIS — Z79899 Other long term (current) drug therapy: Secondary | ICD-10-CM | POA: Diagnosis not present

## 2020-08-23 MED ORDER — FLUCONAZOLE 150 MG PO TABS: 150.0000 mg | ORAL_TABLET | Freq: Every day | ORAL | 2 refills | Status: DC

## 2020-08-23 MED ORDER — NYSTATIN-TRIAMCINOLONE 100000-0.1 UNIT/GM-% EX OINT: 1.0000 "application " | TOPICAL_OINTMENT | Freq: Two times a day (BID) | CUTANEOUS | 3 refills | Status: AC

## 2020-08-23 MED ORDER — HEPARIN SOD (PORK) LOCK FLUSH 100 UNIT/ML IV SOLN
500.0000 [IU] | Freq: Once | INTRAVENOUS | Status: AC | PRN
  Administered 2020-08-23: 500 [IU]
  Filled 2020-08-23: qty 5

## 2020-08-23 MED ORDER — SODIUM CHLORIDE 0.9 % IV SOLN
3.0000 mg/kg | Freq: Once | INTRAVENOUS | Status: AC
  Administered 2020-08-23: 250 mg via INTRAVENOUS
  Filled 2020-08-23: qty 40

## 2020-08-23 MED ORDER — SODIUM CHLORIDE 0.9 % IV SOLN
Freq: Once | INTRAVENOUS | Status: AC
  Filled 2020-08-23: qty 250

## 2020-08-23 MED ORDER — SODIUM CHLORIDE 0.9 % IV SOLN
1.0000 mg/kg | Freq: Once | INTRAVENOUS | Status: AC
  Administered 2020-08-23: 78 mg via INTRAVENOUS
  Filled 2020-08-23: qty 7.8

## 2020-08-23 NOTE — Patient Instructions (Signed)
[facility] Discharge Instructions for Patients Receiving Chemotherapy  Today you received the following chemotherapy agents Ipilumumab, Nivolumab  To help prevent nausea and vomiting after your treatment, we encourage you to take your nausea medication.   If you develop nausea and vomiting that is not controlled by your nausea medication, call the clinic.   BELOW ARE SYMPTOMS THAT SHOULD BE REPORTED IMMEDIATELY:  *FEVER GREATER THAN 100.5 F  *CHILLS WITH OR WITHOUT FEVER  NAUSEA AND VOMITING THAT IS NOT CONTROLLED WITH YOUR NAUSEA MEDICATION  *UNUSUAL SHORTNESS OF BREATH  *UNUSUAL BRUISING OR BLEEDING  TENDERNESS IN MOUTH AND THROAT WITH OR WITHOUT PRESENCE OF ULCERS  *URINARY PROBLEMS  *BOWEL PROBLEMS  UNUSUAL RASH Items with * indicate a potential emergency and should be followed up as soon as possible.  Feel free to call the clinic should you have any questions or concerns at The clinic phone number is [phone].  Please show the [facility] at check-in to the Emergency Department and triage nurse.

## 2020-08-25 ENCOUNTER — Encounter: Payer: Self-pay | Admitting: Oncology

## 2020-08-29 ENCOUNTER — Encounter: Payer: Self-pay | Admitting: Oncology

## 2020-08-31 ENCOUNTER — Encounter: Payer: Self-pay | Admitting: Oncology

## 2020-09-04 NOTE — Progress Notes (Signed)
Will send in request to [facility] for patients appointments on 01/03 and 01/05.

## 2020-09-06 ENCOUNTER — Other Ambulatory Visit: Payer: Self-pay | Admitting: Hematology and Oncology

## 2020-09-06 MED ORDER — CETIRIZINE HCL 10 MG PO TABS: 10.0000 mg | ORAL_TABLET | Freq: Every day | ORAL | 1 refills | Status: DC

## 2020-09-11 ENCOUNTER — Encounter: Payer: Self-pay | Admitting: Hematology and Oncology

## 2020-09-11 ENCOUNTER — Inpatient Hospital Stay: Payer: Medicare Other | Admitting: Hematology and Oncology

## 2020-09-11 ENCOUNTER — Other Ambulatory Visit: Payer: Self-pay | Admitting: Pharmacist

## 2020-09-11 ENCOUNTER — Inpatient Hospital Stay: Payer: Medicare Other

## 2020-09-12 ENCOUNTER — Telehealth: Payer: Self-pay

## 2020-09-12 NOTE — Telephone Encounter (Signed)
I spoke with Dois Davenport, pt's wife @ [phone]. I gave her new appt's for 09/18/2020 & that told her that [facility] will take care of transportation.    Pt's wife LVM omn nurse line @ [phone], req call back regarding next appt & scheduling of [facility] transport.  I have called pt's wife back, with no answer. In the meantime, I have spoken with Jessie,SW. She will R/S [facility] transport for him tomorrow, as new appt is 09/18/2020.

## 2020-09-13 ENCOUNTER — Inpatient Hospital Stay: Payer: Medicare Other

## 2020-09-13 NOTE — Progress Notes (Signed)
Set up transportation for patient on 09/18/20 and 09/20/20, through [facility] transport.

## 2020-09-14 NOTE — Progress Notes (Signed)
[facility]  [address] [place],  [place]  [address] [phone]  Clinic Day:  09/18/2020  Referring physician: Cyndi Bender, PA-C   This document serves as a record of services personally performed by Hosie Poisson, MD. It was created on their behalf by Curry,Lauren E, a trained medical scribe. Scott creation of this record is based on Scott scribe's personal observations and Scott provider's statements to them.   CHIEF COMPLAINT:  CC: Hepatocellular Carcinoma  Current Treatment:  Ipilimumab/nivolumab   HISTORY OF PRESENT ILLNESS:  Scott Farmer is a 65 year old with stage IIIA (T3 N0 M0) multifocal hepatocellular carcinoma diagnosed in February 2021.  Scott Farmer was referred by Cyndi Bender, PA-C in January due to abnormal liver function tests.  Scott Farmer has a history of hepatitis C, which was treated.  Due to abdominal pain and bloating, diarrhea, edema and right upper quadrant ultrasound was done in January.  This revealed at least 3 masses within Scott liver, Scott largest measured 10.2 x 9.3 x 9.5 cm.  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  Scott Farmer was started on rivaroxaban and then referred for consultation.  Scott Farmer had worsening anemia with a hemoglobin of 10.1.  B12 and folate were normal.  Iron studies were equivocal with a low serum iron of 34 and% saturation of 8.3% and high normal TIBC of 409, but ferritin was normal at 122.  A soluble transferrin was obtained and was very elevated, consistent with iron deficiency.  Alpha-fetoprotein was normal, but his CA 19-9 was elevated.  CT chest, abdomen, and pelvis in January confirmed Scott multiple hepatic masses, favored to represent multifocal hepatocellular carcinoma in Scott setting of cirrhosis.  Scott largest mass measured 9.8 x 7.5 cm in Scott high right hepatic lobe.  Perihepatic hemorrhage was centered about Scott left  hepatic lobe, including a hematoma measuring 8.1 x 11.0 cm.  No acute process or evidence of metastatic disease was seen in Scott chest.  MRI head did not reveal any intracranial metastasis.  Core liver biopsy was done in February.  Pathology from this procedure revealed well to moderately differentiated hepatocellular carcinoma, grade 1.  Due to Scott perihepatic hemorrhage, rivaroxaban was discontinued.  Due to Scott iron deficiency, Scott Farmer was advised start on oral iron supplement.  Scott Farmer had worsening hyperbilirubinemia in February prior to starting treatment, with his bilirubin up to 7.6, from 1.4.  Palliative treatment with single agent atezolizumab was recommended and started on February 25th.  This is usually given in combination with bevacizumab, but due to Scott hemorrhage within Scott tumor, we did not recommend bevacizumab.  His bilirubin went down to 5.7.  His 2nd cycle of palliative atezolizumab was delayed on March 18th due to worsening diarrhea, which Scott Farmer had for about 2 weeks.  Stool studies were negative.   Scott Farmer did not tolerate oral iron supplement, so we gave him IV iron in Scott form of Feraheme.  His CA 19-9 had increased from 246 in January to 358 in April, then down to 282 in early May.    CT chest, abdomen and pelvis from May 24th revealed Scott dominant right hepatic lesion is progressive in Scott interval, now measuring 11.6 x 9.8 cm, previously 9.8 x 7.5 cm.  Scott left hepatic mass remains poorly defined making reproducible comparative measurement difficult, but this lesion is similar to prior, now measuring 6.9 x 5.0 cm, previously  7.6 x 5.7 cm.  There is a progressive enhancing lesion in Scott inferior right liver measuring 2.9 cm.  Scott thrombus in Scott left portal vein identified on Scott previous study extends more centrally into Scott main portal vein at Scott confluence today.  Question of IVC thrombus on Scott previous study is less evident.  With these results, we stopped his treatment.  Scott Farmer started Lenvima  12 mg daily on 02/10/2020.  On 02/17/2020, Scott Farmer called reporting severe diarrhea that was not improved with Imodium.  We decreased his dose due to toxicities and started Lomotil p.r.n..  Scott Farmer then required Lenvima to be held due to Grade 3 hepatotoxicity for a total of two weeks.  Scott Farmer restarted Lenvima at 8 mg but continued to have severe toxicities of diarrhea and weight loss.  Scott Farmer was unable to tolerate even 4 mg of Scott Lenvima. Scott oxycodone was not sufficient to relieve his pain and Scott Farmer was changed to hydromorphone. Tramadol and hydrocodone did not provide sufficient relief.  Scott Farmer has not tolerated oxycodone, so Scott Farmer was placed on morphine 15 mg Q4 prn.  CT abdomen and pelvis from September 21st revealed multiple enlarging, bulky liver masses, and an additional new lesion, measuring 1.5 x 1.3 cm.  Scott largest mass in Scott right lobe measures 12.3 x 10.0 cm, previously 11.6 x 9.8 cm.  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  His last CA 19-9 from early September was 438, previously 299 in July.  Scott Farmer was started on ipilimumab/nivolumab immunotherapy on September 28th and completed 2 cycles.  Scott Farmer was seen on November 11th due to reporting dizziness with a fall Scott night prior.  CT head revealed an acute right hemispheric subdural hematoma measuring up to 7 mm in thickness, with minimal mass effect upon Scott underlying right cerebral hemisphere and no midline shift.  There was a small right parietal scalp hematoma.  No evidence of intracranial metastatic disease was seen.  When Scott Farmer was seen in December, Scott Farmer had increased edema, left greater than right, but Doppler was negative for DVT.  INTERVAL HISTORY:  Scott Farmer is here for follow up prior to his 1st cycle of maintenance nivolumab.  Scott Farmer states that Scott Farmer has been doing well other than itching of Scott skin across Scott entire body.  This has persisted despite ointment, Zyrtec, Claritin and Benadryl.  We will try him on Vistaril 50 mg up to three times  daily.  Scott Farmer continues to have abdominal pain which Scott Farmer rates as an 8/10 today.  Scott Farmer continues his current pain regimen of MS Contin 15 mg every 12 hours and Dilaudid 2 mg every 5-6 hours.  Scott Farmer continues to have insomnia despite Remeron 30 mg and lorazepam 1 mg at bedtime.  His edema has improved.  His hemoglobin has improved from 9.9 to 11.6, and his white count and platelets are normal.  Chemistries are unremarkable except for a creatinine of 1.4, improved from 1.5, a stable total bilirubin of 1.5, an SGOT of 92, improved, a SGPT of 117, worse, and an alkaline phosphatase of 465, stable to mildly worse.  CA 19-9 from December was 557, previously 438 in October.  His  appetite is good, and Scott Farmer has lost 3 and 1/2 pounds since his last visit.  Scott Farmer continues to work on his diet and supplements with Boost daily.  Scott Farmer denies fever, chills or other signs of infection.  Scott Farmer denies nausea, vomiting, or bowel issues.  Scott Farmer denies sore throat, cough,  dyspnea, or chest pain.  REVIEW OF SYSTEMS:  Review of Systems  Constitutional: Negative.   HENT:  Negative.   Eyes: Negative.   Respiratory: Negative.   Gastrointestinal: Positive for abdominal pain.  Endocrine: Negative.   Genitourinary: Negative.    Musculoskeletal: Negative.   Skin: Positive for itching.  Neurological: Negative.   Hematological: Negative.   Psychiatric/Behavioral: Positive for sleep disturbance (insomnia).  All other systems reviewed and are negative.    VITALS:  Blood pressure 138/66, pulse 76, temperature 97.9 F (36.6 C), temperature source Oral, resp. rate 18, height 5\' 10"  (1.778 m), weight 179 lb 6.4 oz (81.4 kg), SpO2 94 %.  Wt Readings from Last 3 Encounters:  09/18/20 179 lb 6.4 oz (81.4 kg)  08/23/20 182 lb 12 oz (82.9 kg)  08/21/20 182 lb 8 oz (82.8 kg)    Body mass index is 25.74 kg/m.  Performance status (ECOG): 1 - Symptomatic but completely ambulatory  PHYSICAL EXAM:  Physical Exam Constitutional:      General: Scott Farmer is  not in acute distress.    Appearance: Normal appearance. Scott Farmer is normal weight.  HENT:     Head: Normocephalic and atraumatic.  Eyes:     General: No scleral icterus.    Extraocular Movements: Extraocular movements intact.     Conjunctiva/sclera: Conjunctivae normal.     Pupils: Pupils are equal, round, and reactive to light.  Cardiovascular:     Rate and Rhythm: Normal rate and regular rhythm.     Pulses: Normal pulses.     Heart sounds: Normal heart sounds. No murmur heard. No friction rub. No gallop.   Pulmonary:     Effort: Pulmonary effort is normal. No respiratory distress.     Breath sounds: Normal breath sounds.  Abdominal:     General: Bowel sounds are normal. There is no distension.     Palpations: Abdomen is soft. There is hepatomegaly (about 8 cm below Scott right costal margin, tender and firm, with a firm left lobe) and splenomegaly (possible and mild). There is no mass.     Tenderness: There is no abdominal tenderness.  Musculoskeletal:        General: Normal range of motion.     Cervical back: Normal range of motion and neck supple.     Right lower leg: Edema (trace) present.     Left lower leg: Edema (trace) present.  Lymphadenopathy:     Cervical: No cervical adenopathy.  Skin:    General: Skin is warm and dry.     Comments: Scott Farmer has a nonspecific rash of Scott back and buttock bilaterally  Neurological:     General: No focal deficit present.     Mental Status: Scott Farmer is alert and oriented to person, place, and time. Mental status is at baseline.  Psychiatric:        Mood and Affect: Mood normal.        Behavior: Behavior normal.        Thought Content: Thought content normal.        Judgment: Judgment normal.   Lymph Nodes:  There is no palpable cervical, clavicular, axillary or inguinal lymphadenopathy.   LABS:   His hemoglobin has improved from 9.9 to 11.6, and his white count and platelets are normal.  Chemistries are unremarkable except for a creatinine of 1.4,  improved from 1.5, a stable total bilirubin of 1.5, an SGOT of 92, improved, a SGPT of 117, worse, and an alkaline phosphatase of 465, stable to mildly  worse.  CA 19-9 from December was 557, previously 438 in October.  STUDIES:   No current studies.  Allergies:  Allergies  Allergen Reactions  . Hydromorphone Itching  . Lortab [Hydrocodone-Acetaminophen] Itching    Current Medications: Current Outpatient Medications  Medication Sig Dispense Refill  . LORazepam (ATIVAN) 1 MG tablet TAKE 1 TABLET BY MOUTH EVERY 8 HOURS AS NEEDED FOR ANXIETY 60 tablet 0  . cetirizine (ALLERGY, CETIRIZINE,) 10 MG tablet Take 1 tablet (10 mg total) by mouth daily. 30 tablet 1  . diphenoxylate-atropine (LOMOTIL) 2.5-0.025 MG tablet Take by mouth.    . fluconazole (DIFLUCAN) 150 MG tablet Take 1 tablet (150 mg total) by mouth daily. 1 tablet 2  . furosemide (LASIX) 20 MG tablet Take 20 mg by mouth daily.    Marland Kitchen HYDROmorphone (DILAUDID) 2 MG tablet Take 2 mg by mouth 4 (four) times daily.    Marland Kitchen LENVIMA, 8 MG DAILY DOSE, 2 x 4 MG capsule Take by mouth.    . loperamide (IMODIUM) 2 MG capsule Take by mouth as needed for diarrhea or loose stools.    . mirtazapine (REMERON) 30 MG tablet Take 30 mg by mouth at bedtime.    Marland Kitchen morphine (MS CONTIN) 15 MG 12 hr tablet Take 15 mg by mouth 2 (two) times daily as needed.    . nystatin-triamcinolone ointment (MYCOLOG) Apply 1 application topically 2 (two) times daily. 30 g 3  . omeprazole (PRILOSEC) 40 MG capsule Take 40 mg by mouth daily.    . ondansetron (ZOFRAN) 4 MG tablet Take 4 mg by mouth every 8 (eight) hours as needed for nausea or vomiting.    . Oxycodone HCl 10 MG TABS Take by mouth.    . polyethylene glycol (MIRALAX / GLYCOLAX) 17 g packet Take 17 g by mouth daily.    . prochlorperazine (COMPAZINE) 10 MG tablet Take 10 mg by mouth every 6 (six) hours as needed for nausea or vomiting.    . sennosides-docusate sodium (SENOKOT-S) 8.6-50 MG tablet Take 1 tablet by mouth  daily.     No current facility-administered medications for this visit.     ASSESSMENT & PLAN:   Assessment:  1. Stage IIIA hepatocellular carcinoma, for which Scott Farmer was receiving palliative atezolizumab. However, imaging results revealed progressive disease, and was therefore stopped.  Scott Farmer began lenvatinib 12 mg daily in June, but was unable to tolerate that even at lower doses.  Scott Farmer is now receiving palliative ipilimumab/ nivolumab, and has completed 4 cycles.  We will now start maintenance nivolumab monthly.  2. Hemorrhage into Scott tumor, resolved.    3. Iron deficiency anemia, improved on IV iron.  Scott Farmer has not required IV iron since March.    4. Hyperbilirubinemia and elevation of Scott liver transaminases.  These have started to improve.  Most likely this is treatment induced.  5. 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  Scott Farmer has been instructed to stay off anticoagulant or aspirin.  6. History of hepatitis C, treated.  7. Chronic lower extremity swelling and pain, which has improved.  Scott Farmer continues hydrochlorothiazide.  We do not recommend resuming furosemide due to Scott worsening renal function.  Doppler ultrasound was negative for DVT.  I did recommend compression hose.    8. Chronic kidney disease, improved.    Scott Farmer has been drinking plenty of fluids and does not appear dehydrated.  9. Small, 7 mm right subdural hematoma after a fall in November. Scott Farmer was discharged home to have close observation by his  wife.  Scott Farmer has no neurologic signs or symptoms today.  10.  Moderate whole body skin pruritis despite ointment, Zyrtec, Claritin and Benadryl.  This could be due to his treatment, his liver disease, or reaction to other medications such as morphine.  We will try having him stop the MS Contin, and I will try him on Vistaril 50 mg up to three times daily.    Plan:     As Scott Farmer is having whole body pruritis, I will try him on Vistaril 50 mg up to three daily, and advised that Scott Farmer discontinue the MS  Contin to see if this improves.  Scott Farmer may continue Dilaudid 2 mg as needed for his pain, and I will send in a refill today.  If Scott pruritis is caused by his treatment, this may improve as Scott Farmer is now only on maintenance therapy.  Scott Farmer will proceed with his 1st cycle of maintenance nivolumab on January 12th.  We will see him back in 4 weeks with CBC, CMP, CA 19-9 and CT abdomen and pelvis prior to a 2nd cycle of maintenance nivolumab.  Scott Farmer and his wife understand Scott plans discussed today and are in agreement with them.  They know to contact our office if Scott Farmer develops issues requiring immediate clinical assessment.   Derwood Kaplan, MD [facility] [place]    I, Rita Ohara, am acting as scribe for Derwood Kaplan, MD  I have reviewed this report as typed by Scott medical scribe, and it is complete and accurate.

## 2020-09-15 ENCOUNTER — Other Ambulatory Visit: Payer: Self-pay | Admitting: Hematology and Oncology

## 2020-09-15 ENCOUNTER — Other Ambulatory Visit: Payer: Self-pay | Admitting: Oncology

## 2020-09-15 DIAGNOSIS — T1490XA Injury, unspecified, initial encounter: Secondary | ICD-10-CM

## 2020-09-15 DIAGNOSIS — C220 Liver cell carcinoma: Secondary | ICD-10-CM

## 2020-09-15 NOTE — Telephone Encounter (Signed)
Sent!

## 2020-09-18 ENCOUNTER — Encounter: Payer: Self-pay | Admitting: Oncology

## 2020-09-18 ENCOUNTER — Other Ambulatory Visit: Payer: Self-pay | Admitting: Hematology and Oncology

## 2020-09-18 ENCOUNTER — Other Ambulatory Visit: Payer: Self-pay

## 2020-09-18 ENCOUNTER — Inpatient Hospital Stay: Payer: Medicare Other | Attending: Oncology

## 2020-09-18 ENCOUNTER — Other Ambulatory Visit: Payer: Self-pay | Admitting: Oncology

## 2020-09-18 ENCOUNTER — Telehealth: Payer: Self-pay

## 2020-09-18 ENCOUNTER — Inpatient Hospital Stay (INDEPENDENT_AMBULATORY_CARE_PROVIDER_SITE_OTHER): Payer: Medicare Other | Admitting: Oncology

## 2020-09-18 ENCOUNTER — Telehealth: Payer: Self-pay | Admitting: Oncology

## 2020-09-18 VITALS — BP 138/66 | HR 76 | Temp 97.9°F | Resp 18 | Ht 70.0 in | Wt 179.4 lb

## 2020-09-18 DIAGNOSIS — C220 Liver cell carcinoma: Secondary | ICD-10-CM

## 2020-09-18 DIAGNOSIS — N189 Chronic kidney disease, unspecified: Secondary | ICD-10-CM | POA: Diagnosis not present

## 2020-09-18 DIAGNOSIS — G893 Neoplasm related pain (acute) (chronic): Secondary | ICD-10-CM | POA: Insufficient documentation

## 2020-09-18 DIAGNOSIS — D509 Iron deficiency anemia, unspecified: Secondary | ICD-10-CM

## 2020-09-18 DIAGNOSIS — Z79899 Other long term (current) drug therapy: Secondary | ICD-10-CM | POA: Insufficient documentation

## 2020-09-18 DIAGNOSIS — Z5112 Encounter for antineoplastic immunotherapy: Secondary | ICD-10-CM | POA: Insufficient documentation

## 2020-09-18 DIAGNOSIS — L299 Pruritus, unspecified: Secondary | ICD-10-CM | POA: Diagnosis not present

## 2020-09-18 LAB — CBC AND DIFFERENTIAL
HCT: 35 — AB (ref 41–53)
Hemoglobin: 11.6 — AB (ref 13.5–17.5)
Neutrophils Absolute: 3.47
Platelets: 316 (ref 150–399)
WBC: 5.6

## 2020-09-18 LAB — HEPATIC FUNCTION PANEL
ALT: 117 — AB (ref 10–40)
AST: 92 — AB (ref 14–40)
Alkaline Phosphatase: 465 — AB (ref 25–125)
Bilirubin, Total: 1.5

## 2020-09-18 LAB — COMPREHENSIVE METABOLIC PANEL
Albumin: 3.3 — AB (ref 3.5–5.0)
Calcium: 9.9 (ref 8.7–10.7)

## 2020-09-18 LAB — BASIC METABOLIC PANEL
BUN: 12 (ref 4–21)
CO2: 24 — AB (ref 13–22)
Chloride: 108 (ref 99–108)
Creatinine: 1.4 — AB (ref 0.6–1.3)
Glucose: 96
Potassium: 4 (ref 3.4–5.3)
Sodium: 138 (ref 137–147)

## 2020-09-18 LAB — CBC: RBC: 3.62 — AB (ref 3.87–5.11)

## 2020-09-18 LAB — TSH: TSH: 3.497 u[IU]/mL (ref 0.350–4.500)

## 2020-09-18 LAB — CORRECTED CALCIUM (CC13): Calcium, Corrected: 10.6

## 2020-09-18 MED ORDER — HYDROXYZINE PAMOATE 50 MG PO CAPS: 50.0000 mg | ORAL_CAPSULE | Freq: Three times a day (TID) | ORAL | 5 refills | Status: AC | PRN

## 2020-09-18 MED ORDER — HYDROXYZINE PAMOATE 50 MG PO CAPS: 50.0000 mg | ORAL_CAPSULE | Freq: Three times a day (TID) | ORAL | 0 refills | Status: DC | PRN

## 2020-09-18 MED ORDER — HYDROMORPHONE HCL 2 MG PO TABS: 2.0000 mg | ORAL_TABLET | ORAL | 0 refills | Status: AC | PRN

## 2020-09-18 MED ORDER — HYDROMORPHONE HCL 2 MG PO TABS: 2.0000 mg | ORAL_TABLET | ORAL | 0 refills | Status: DC | PRN

## 2020-09-18 NOTE — Telephone Encounter (Signed)
Talked with Pharmacy Tech at [facility] in [place]. They are in the process of getting the medication ready with no problems.

## 2020-09-18 NOTE — Telephone Encounter (Signed)
-----   Message from Derwood Kaplan, MD sent at 09/18/2020  9:53 AM EST ----- Regarding: pharm Pls tell [facility] that I changed rx on hydroxyzine to 50 mg tid prn, #60 with 5 ref.

## 2020-09-18 NOTE — Telephone Encounter (Signed)
Per 1/10 los next appt given to patient 

## 2020-09-19 LAB — T4: T4, Total: 15.9 ug/dL — ABNORMAL HIGH (ref 4.5–12.0)

## 2020-09-19 LAB — CANCER ANTIGEN 19-9: CA 19-9: 795 U/mL — ABNORMAL HIGH (ref 0–35)

## 2020-09-19 NOTE — Progress Notes (Signed)
Enrolled patient into the [facility], [place]. 06/21/2020-09/18/2021 ID: [id number]

## 2020-09-20 ENCOUNTER — Inpatient Hospital Stay: Payer: Medicare Other

## 2020-09-21 ENCOUNTER — Other Ambulatory Visit: Payer: Self-pay

## 2020-09-21 ENCOUNTER — Inpatient Hospital Stay: Payer: Medicare Other

## 2020-09-21 ENCOUNTER — Encounter: Payer: Self-pay | Admitting: Oncology

## 2020-09-21 ENCOUNTER — Other Ambulatory Visit: Payer: Self-pay | Admitting: Hematology and Oncology

## 2020-09-21 VITALS — BP 151/74 | HR 77 | Temp 98.3°F | Resp 18 | Ht 70.0 in | Wt 174.5 lb

## 2020-09-21 DIAGNOSIS — R112 Nausea with vomiting, unspecified: Secondary | ICD-10-CM

## 2020-09-21 DIAGNOSIS — Z5112 Encounter for antineoplastic immunotherapy: Secondary | ICD-10-CM | POA: Diagnosis not present

## 2020-09-21 DIAGNOSIS — C220 Liver cell carcinoma: Secondary | ICD-10-CM

## 2020-09-21 MED ORDER — NIVOLUMAB CHEMO INJECTION 100 MG/10ML
480.0000 mg | Freq: Once | INTRAVENOUS | Status: AC
  Administered 2020-09-21: 480 mg via INTRAVENOUS
  Filled 2020-09-21: qty 48

## 2020-09-21 MED ORDER — SODIUM CHLORIDE 0.9 % IV SOLN
Freq: Once | INTRAVENOUS | Status: AC
  Filled 2020-09-21: qty 250

## 2020-09-21 MED ORDER — SODIUM CHLORIDE 0.9 % IV SOLN: Freq: Once | INTRAVENOUS | Status: DC

## 2020-09-21 MED ORDER — ONDANSETRON HCL 4 MG/2ML IJ SOLN
8.0000 mg | Freq: Once | INTRAMUSCULAR | Status: AC
  Administered 2020-09-21: 8 mg via INTRAVENOUS

## 2020-09-21 MED ORDER — ONDANSETRON HCL 4 MG/2ML IJ SOLN
INTRAMUSCULAR | Status: AC
  Filled 2020-09-21: qty 4

## 2020-09-21 NOTE — Patient Instructions (Signed)
Nivolumab injection What is this medicine? NIVOLUMAB (nye VOL ue mab) is a monoclonal antibody. It treats certain types of cancer. Some of the cancers treated are colon cancer, head and neck cancer, Hodgkin lymphoma, lung cancer, and melanoma. This medicine may be used for other purposes; ask your health care provider or pharmacist if you have questions. COMMON BRAND NAME(S): Opdivo What should I tell my health care provider before I take this medicine? They need to know if you have any of these conditions:  autoimmune diseases like Crohn's disease, ulcerative colitis, or lupus  have had or planning to have an allogeneic stem cell transplant (uses someone else's stem cells)  history of chest radiation  history of organ transplant  nervous system problems like myasthenia gravis or Guillain-Barre syndrome  an unusual or allergic reaction to nivolumab, other medicines, foods, dyes, or preservatives  pregnant or trying to get pregnant  breast-feeding How should I use this medicine? This medicine is for infusion into a vein. It is given by a health care professional in a hospital or clinic setting. A special MedGuide will be given to you before each treatment. Be sure to read this information carefully each time. Talk to your pediatrician regarding the use of this medicine in children. While this drug may be prescribed for children as young as 12 years for selected conditions, precautions do apply. Overdosage: If you think you have taken too much of this medicine contact a poison control center or emergency room at once. NOTE: This medicine is only for you. Do not share this medicine with others. What if I miss a dose? It is important not to miss your dose. Call your doctor or health care professional if you are unable to keep an appointment. What may interact with this medicine? Interactions have not been studied. This list may not describe all possible interactions. Give your health  care provider a list of all the medicines, herbs, non-prescription drugs, or dietary supplements you use. Also tell them if you smoke, drink alcohol, or use illegal drugs. Some items may interact with your medicine. What should I watch for while using this medicine? This drug may make you feel generally unwell. Continue your course of treatment even though you feel ill unless your doctor tells you to stop. You may need blood work done while you are taking this medicine. Do not become pregnant while taking this medicine or for 5 months after stopping it. Women should inform their doctor if they wish to become pregnant or think they might be pregnant. There is a potential for serious side effects to an unborn child. Talk to your health care professional or pharmacist for more information. Do not breast-feed an infant while taking this medicine or for 5 months after stopping it. What side effects may I notice from receiving this medicine? Side effects that you should report to your doctor or health care professional as soon as possible:  allergic reactions like skin rash, itching or hives, swelling of the face, lips, or tongue  breathing problems  blood in the urine  bloody or watery diarrhea or black, tarry stools  changes in emotions or moods  changes in vision  chest pain  cough  dizziness  feeling faint or lightheaded, falls  fever, chills  headache with fever, neck stiffness, confusion, loss of memory, sensitivity to light, hallucination, loss of contact with reality, or seizures  joint pain  mouth sores  redness, blistering, peeling or loosening of the skin, including inside the   mouth  severe muscle pain or weakness  signs and symptoms of high blood sugar such as dizziness; dry mouth; dry skin; fruity breath; nausea; stomach pain; increased hunger or thirst; increased urination  signs and symptoms of kidney injury like trouble passing urine or change in the amount of  urine  signs and symptoms of liver injury like dark yellow or brown urine; general ill feeling or flu-like symptoms; light-colored stools; loss of appetite; nausea; right upper belly pain; unusually weak or tired; yellowing of the eyes or skin  swelling of the ankles, feet, hands  trouble passing urine or change in the amount of urine  unusually weak or tired  weight gain or loss Side effects that usually do not require medical attention (report to your doctor or health care professional if they continue or are bothersome):  bone pain  constipation  decreased appetite  diarrhea  muscle pain  nausea, vomiting  tiredness This list may not describe all possible side effects. Call your doctor for medical advice about side effects. You may report side effects to FDA at 1-800-FDA-1088. Where should I keep my medicine? This drug is given in a hospital or clinic and will not be stored at home. NOTE: This sheet is a summary. It may not cover all possible information. If you have questions about this medicine, talk to your doctor, pharmacist, or health care provider.  2021 Elsevier/Gold Standard (2019-12-29 10:08:25)  

## 2020-09-22 NOTE — Progress Notes (Signed)
Sent in transportation Request for patient for DOS 09/29/20 and 10/03/20

## 2020-09-27 ENCOUNTER — Telehealth: Payer: Self-pay | Admitting: Oncology

## 2020-09-27 NOTE — Progress Notes (Signed)
Sent in cancellation request for DOS 01/21 and 01/25, due to weather. Sent in new request for transportation for DOS 01/26 and 01/31.

## 2020-09-27 NOTE — Telephone Encounter (Signed)
09/27/20 Spoke with wife andrs all appts

## 2020-09-30 ENCOUNTER — Other Ambulatory Visit: Payer: Self-pay

## 2020-09-30 ENCOUNTER — Emergency Department (HOSPITAL_COMMUNITY): Payer: Medicare Other

## 2020-09-30 ENCOUNTER — Encounter (HOSPITAL_COMMUNITY): Payer: Self-pay | Admitting: *Deleted

## 2020-09-30 ENCOUNTER — Inpatient Hospital Stay (HOSPITAL_COMMUNITY)
Admission: EM | Admit: 2020-09-30 | Discharge: 2020-10-03 | DRG: 377 | Disposition: A | Discharge status: Hospice | Payer: Medicare Other | Attending: Internal Medicine | Admitting: Internal Medicine

## 2020-09-30 ENCOUNTER — Inpatient Hospital Stay (HOSPITAL_COMMUNITY): Payer: Medicare Other

## 2020-09-30 DIAGNOSIS — K5520 Angiodysplasia of colon without hemorrhage: Secondary | ICD-10-CM | POA: Diagnosis not present

## 2020-09-30 DIAGNOSIS — R112 Nausea with vomiting, unspecified: Secondary | ICD-10-CM

## 2020-09-30 DIAGNOSIS — K922 Gastrointestinal hemorrhage, unspecified: Secondary | ICD-10-CM | POA: Diagnosis not present

## 2020-09-30 DIAGNOSIS — N179 Acute kidney failure, unspecified: Secondary | ICD-10-CM | POA: Diagnosis not present

## 2020-09-30 DIAGNOSIS — E43 Unspecified severe protein-calorie malnutrition: Secondary | ICD-10-CM

## 2020-09-30 DIAGNOSIS — C220 Liver cell carcinoma: Secondary | ICD-10-CM | POA: Diagnosis not present

## 2020-09-30 DIAGNOSIS — R7989 Other specified abnormal findings of blood chemistry: Secondary | ICD-10-CM

## 2020-09-30 DIAGNOSIS — Z789 Other specified health status: Secondary | ICD-10-CM | POA: Diagnosis not present

## 2020-09-30 DIAGNOSIS — R680 Hypothermia, not associated with low environmental temperature: Secondary | ICD-10-CM | POA: Diagnosis present

## 2020-09-30 DIAGNOSIS — D62 Acute posthemorrhagic anemia: Secondary | ICD-10-CM | POA: Diagnosis present

## 2020-09-30 DIAGNOSIS — Z66 Do not resuscitate: Secondary | ICD-10-CM | POA: Diagnosis not present

## 2020-09-30 DIAGNOSIS — R52 Pain, unspecified: Secondary | ICD-10-CM | POA: Diagnosis not present

## 2020-09-30 DIAGNOSIS — R54 Age-related physical debility: Secondary | ICD-10-CM | POA: Diagnosis present

## 2020-09-30 DIAGNOSIS — I959 Hypotension, unspecified: Secondary | ICD-10-CM | POA: Diagnosis present

## 2020-09-30 DIAGNOSIS — W1811XA Fall from or off toilet without subsequent striking against object, initial encounter: Secondary | ICD-10-CM | POA: Diagnosis not present

## 2020-09-30 DIAGNOSIS — K5521 Angiodysplasia of colon with hemorrhage: Principal | ICD-10-CM | POA: Diagnosis present

## 2020-09-30 DIAGNOSIS — N1832 Chronic kidney disease, stage 3b: Secondary | ICD-10-CM | POA: Diagnosis present

## 2020-09-30 DIAGNOSIS — K7469 Other cirrhosis of liver: Secondary | ICD-10-CM | POA: Diagnosis present

## 2020-09-30 DIAGNOSIS — E785 Hyperlipidemia, unspecified: Secondary | ICD-10-CM | POA: Diagnosis present

## 2020-09-30 DIAGNOSIS — E7220 Disorder of urea cycle metabolism, unspecified: Secondary | ICD-10-CM

## 2020-09-30 DIAGNOSIS — I81 Portal vein thrombosis: Secondary | ICD-10-CM | POA: Diagnosis present

## 2020-09-30 DIAGNOSIS — B182 Chronic viral hepatitis C: Secondary | ICD-10-CM | POA: Diagnosis present

## 2020-09-30 DIAGNOSIS — Z79899 Other long term (current) drug therapy: Secondary | ICD-10-CM

## 2020-09-30 DIAGNOSIS — K219 Gastro-esophageal reflux disease without esophagitis: Secondary | ICD-10-CM | POA: Diagnosis present

## 2020-09-30 DIAGNOSIS — H409 Unspecified glaucoma: Secondary | ICD-10-CM | POA: Diagnosis present

## 2020-09-30 DIAGNOSIS — F17210 Nicotine dependence, cigarettes, uncomplicated: Secondary | ICD-10-CM | POA: Diagnosis present

## 2020-09-30 DIAGNOSIS — R1011 Right upper quadrant pain: Secondary | ICD-10-CM | POA: Diagnosis not present

## 2020-09-30 DIAGNOSIS — D508 Other iron deficiency anemias: Secondary | ICD-10-CM | POA: Diagnosis present

## 2020-09-30 DIAGNOSIS — I9589 Other hypotension: Secondary | ICD-10-CM | POA: Diagnosis not present

## 2020-09-30 DIAGNOSIS — K7460 Unspecified cirrhosis of liver: Secondary | ICD-10-CM | POA: Diagnosis present

## 2020-09-30 DIAGNOSIS — L299 Pruritus, unspecified: Secondary | ICD-10-CM | POA: Diagnosis present

## 2020-09-30 DIAGNOSIS — K921 Melena: Secondary | ICD-10-CM

## 2020-09-30 DIAGNOSIS — E876 Hypokalemia: Secondary | ICD-10-CM | POA: Diagnosis present

## 2020-09-30 DIAGNOSIS — E861 Hypovolemia: Secondary | ICD-10-CM | POA: Diagnosis not present

## 2020-09-30 DIAGNOSIS — I129 Hypertensive chronic kidney disease with stage 1 through stage 4 chronic kidney disease, or unspecified chronic kidney disease: Secondary | ICD-10-CM | POA: Diagnosis present

## 2020-09-30 DIAGNOSIS — Z9221 Personal history of antineoplastic chemotherapy: Secondary | ICD-10-CM

## 2020-09-30 DIAGNOSIS — Z515 Encounter for palliative care: Secondary | ICD-10-CM

## 2020-09-30 DIAGNOSIS — Z23 Encounter for immunization: Secondary | ICD-10-CM | POA: Diagnosis present

## 2020-09-30 DIAGNOSIS — Z7189 Other specified counseling: Secondary | ICD-10-CM

## 2020-09-30 DIAGNOSIS — K648 Other hemorrhoids: Secondary | ICD-10-CM | POA: Diagnosis present

## 2020-09-30 DIAGNOSIS — Z79891 Long term (current) use of opiate analgesic: Secondary | ICD-10-CM

## 2020-09-30 DIAGNOSIS — G8929 Other chronic pain: Secondary | ICD-10-CM | POA: Diagnosis present

## 2020-09-30 DIAGNOSIS — Z885 Allergy status to narcotic agent status: Secondary | ICD-10-CM

## 2020-09-30 DIAGNOSIS — Z20822 Contact with and (suspected) exposure to covid-19: Secondary | ICD-10-CM | POA: Diagnosis present

## 2020-09-30 DIAGNOSIS — S01511A Laceration without foreign body of lip, initial encounter: Secondary | ICD-10-CM | POA: Diagnosis present

## 2020-09-30 HISTORY — DX: Malignant (primary) neoplasm, unspecified: C80.1

## 2020-09-30 LAB — CBC WITH DIFFERENTIAL/PLATELET
Abs Immature Granulocytes: 0.37 10*3/uL — ABNORMAL HIGH (ref 0.00–0.07)
Basophils Absolute: 0 10*3/uL (ref 0.0–0.1)
Basophils Relative: 0 %
Eosinophils Absolute: 0 10*3/uL (ref 0.0–0.5)
Eosinophils Relative: 0 %
HCT: 20.1 % — ABNORMAL LOW (ref 39.0–52.0)
Hemoglobin: 6.1 g/dL — CL (ref 13.0–17.0)
Immature Granulocytes: 3 %
Lymphocytes Relative: 17 %
Lymphs Abs: 2 10*3/uL (ref 0.7–4.0)
MCH: 30.7 pg (ref 26.0–34.0)
MCHC: 30.3 g/dL (ref 30.0–36.0)
MCV: 101 fL — ABNORMAL HIGH (ref 80.0–100.0)
Monocytes Absolute: 1 10*3/uL (ref 0.1–1.0)
Monocytes Relative: 9 %
Neutro Abs: 8.2 10*3/uL — ABNORMAL HIGH (ref 1.7–7.7)
Neutrophils Relative %: 71 %
Platelets: 283 10*3/uL (ref 150–400)
RBC: 1.99 MIL/uL — ABNORMAL LOW (ref 4.22–5.81)
RDW: 19.9 % — ABNORMAL HIGH (ref 11.5–15.5)
WBC: 11.7 10*3/uL — ABNORMAL HIGH (ref 4.0–10.5)
nRBC: 0.3 % — ABNORMAL HIGH (ref 0.0–0.2)

## 2020-09-30 LAB — AMMONIA: Ammonia: 97 umol/L — ABNORMAL HIGH (ref 9–35)

## 2020-09-30 LAB — COMPREHENSIVE METABOLIC PANEL
ALT: 108 U/L — ABNORMAL HIGH (ref 0–44)
AST: 194 U/L — ABNORMAL HIGH (ref 15–41)
Albumin: 1.7 g/dL — ABNORMAL LOW (ref 3.5–5.0)
Alkaline Phosphatase: 217 U/L — ABNORMAL HIGH (ref 38–126)
Anion gap: 19 — ABNORMAL HIGH (ref 5–15)
BUN: 21 mg/dL (ref 8–23)
CO2: 16 mmol/L — ABNORMAL LOW (ref 22–32)
Calcium: 8.5 mg/dL — ABNORMAL LOW (ref 8.9–10.3)
Chloride: 107 mmol/L (ref 98–111)
Creatinine, Ser: 2.02 mg/dL — ABNORMAL HIGH (ref 0.61–1.24)
GFR, Estimated: 36 mL/min — ABNORMAL LOW (ref >60)
Glucose, Bld: 97 mg/dL (ref 70–99)
Potassium: 4.2 mmol/L (ref 3.5–5.1)
Sodium: 142 mmol/L (ref 135–145)
Total Bilirubin: 1.5 mg/dL — ABNORMAL HIGH (ref 0.3–1.2)
Total Protein: 4.6 g/dL — ABNORMAL LOW (ref 6.5–8.1)

## 2020-09-30 LAB — CBC
HCT: 25.3 % — ABNORMAL LOW (ref 39.0–52.0)
Hemoglobin: 8.4 g/dL — ABNORMAL LOW (ref 13.0–17.0)
MCH: 31.6 pg (ref 26.0–34.0)
MCHC: 33.2 g/dL (ref 30.0–36.0)
MCV: 95.1 fL (ref 80.0–100.0)
Platelets: 218 10*3/uL (ref 150–400)
RBC: 2.66 MIL/uL — ABNORMAL LOW (ref 4.22–5.81)
RDW: 17.2 % — ABNORMAL HIGH (ref 11.5–15.5)
WBC: 18 10*3/uL — ABNORMAL HIGH (ref 4.0–10.5)
nRBC: 0.5 % — ABNORMAL HIGH (ref 0.0–0.2)

## 2020-09-30 LAB — ABO/RH: ABO/RH(D): O POS

## 2020-09-30 LAB — POC OCCULT BLOOD, ED: Fecal Occult Bld: POSITIVE — AB

## 2020-09-30 LAB — LACTIC ACID, PLASMA
Lactic Acid, Venous: 10.8 mmol/L (ref 0.5–1.9)
Lactic Acid, Venous: >11 mmol/L (ref 0.5–1.9)

## 2020-09-30 LAB — LIPASE, BLOOD: Lipase: 12 U/L (ref 11–51)

## 2020-09-30 LAB — PROTIME-INR
INR: 1.2 (ref 0.8–1.2)
Prothrombin Time: 14.4 seconds (ref 11.4–15.2)

## 2020-09-30 LAB — HIV ANTIBODY (ROUTINE TESTING W REFLEX): HIV Screen 4th Generation wRfx: NONREACTIVE

## 2020-09-30 LAB — SARS CORONAVIRUS 2 (TAT 6-24 HRS): SARS Coronavirus 2: NEGATIVE

## 2020-09-30 MED ORDER — LACTATED RINGERS IV SOLN: INTRAVENOUS | Status: DC

## 2020-09-30 MED ORDER — TETANUS-DIPHTH-ACELL PERTUSSIS 5-2.5-18.5 LF-MCG/0.5 IM SUSY
0.5000 mL | PREFILLED_SYRINGE | Freq: Once | INTRAMUSCULAR | Status: AC
  Administered 2020-09-30: 0.5 mL via INTRAMUSCULAR
  Filled 2020-09-30: qty 0.5

## 2020-09-30 MED ORDER — PEG-KCL-NACL-NASULF-NA ASC-C 100 G PO SOLR: 1.0000 | Freq: Once | ORAL | Status: DC

## 2020-09-30 MED ORDER — LIDOCAINE-EPINEPHRINE 1 %-1:100000 IJ SOLN
10.0000 mL | Freq: Once | INTRAMUSCULAR | Status: AC
  Administered 2020-09-30: 10 mL
  Filled 2020-09-30: qty 1

## 2020-09-30 MED ORDER — METOCLOPRAMIDE HCL 5 MG/ML IJ SOLN
10.0000 mg | Freq: Once | INTRAMUSCULAR | Status: AC
  Administered 2020-09-30: 10 mg via INTRAVENOUS
  Filled 2020-09-30: qty 2

## 2020-09-30 MED ORDER — SODIUM CHLORIDE 0.9% FLUSH: 10.0000 mL | INTRAVENOUS | Status: DC | PRN

## 2020-09-30 MED ORDER — PEG-KCL-NACL-NASULF-NA ASC-C 100 G PO SOLR
0.5000 | Freq: Once | ORAL | Status: AC
  Administered 2020-09-30: 100 g via ORAL
  Filled 2020-09-30: qty 1

## 2020-09-30 MED ORDER — LACTATED RINGERS IV BOLUS
1000.0000 mL | Freq: Once | INTRAVENOUS | Status: AC
  Administered 2020-09-30: 1000 mL via INTRAVENOUS

## 2020-09-30 MED ORDER — SODIUM CHLORIDE 0.9 % IV SOLN
80.0000 mg | Freq: Once | INTRAVENOUS | Status: AC
  Administered 2020-09-30: 80 mg via INTRAVENOUS
  Filled 2020-09-30: qty 80

## 2020-09-30 MED ORDER — ONDANSETRON HCL 4 MG/2ML IJ SOLN
4.0000 mg | Freq: Once | INTRAMUSCULAR | Status: AC
  Administered 2020-09-30: 4 mg via INTRAVENOUS
  Filled 2020-09-30: qty 2

## 2020-09-30 MED ORDER — NICOTINE 14 MG/24HR TD PT24
14.0000 mg | MEDICATED_PATCH | Freq: Every day | TRANSDERMAL | Status: DC
  Administered 2020-09-30 – 2020-10-03 (×3): 14 mg via TRANSDERMAL
  Filled 2020-09-30 (×3): qty 1

## 2020-09-30 MED ORDER — SODIUM CHLORIDE 0.9 % IV SOLN
8.0000 mg/h | INTRAVENOUS | Status: DC
  Administered 2020-09-30 – 2020-10-01 (×4): 8 mg/h via INTRAVENOUS
  Filled 2020-09-30 (×5): qty 80

## 2020-09-30 MED ORDER — CHLORHEXIDINE GLUCONATE CLOTH 2 % EX PADS
6.0000 | MEDICATED_PAD | Freq: Every day | CUTANEOUS | Status: DC
  Administered 2020-09-30 – 2020-10-03 (×3): 6 via TOPICAL

## 2020-09-30 MED ORDER — LACTATED RINGERS IV BOLUS
30.0000 mL/kg | Freq: Once | INTRAVENOUS | Status: AC
  Administered 2020-09-30: 2376 mL via INTRAVENOUS

## 2020-09-30 MED ORDER — SODIUM CHLORIDE 0.9 % IV SOLN: 10.0000 mL/h | Freq: Once | INTRAVENOUS | Status: DC

## 2020-09-30 MED ORDER — SODIUM CHLORIDE 0.9 % IV SOLN
2.0000 g | Freq: Once | INTRAVENOUS | Status: AC
  Administered 2020-09-30: 2 g via INTRAVENOUS
  Filled 2020-09-30: qty 2

## 2020-09-30 MED ORDER — HYDROMORPHONE HCL 1 MG/ML PO LIQD
1.0000 mg | ORAL | Status: DC | PRN
  Administered 2020-09-30 – 2020-10-03 (×8): 1 mg via ORAL
  Filled 2020-09-30 (×9): qty 1

## 2020-09-30 MED ORDER — PEG-KCL-NACL-NASULF-NA ASC-C 100 G PO SOLR
0.5000 | Freq: Once | ORAL | Status: DC
  Filled 2020-09-30: qty 1

## 2020-09-30 NOTE — ED Notes (Signed)
30ml via bladder scan.

## 2020-09-30 NOTE — ED Triage Notes (Signed)
PT here via [facility] for unwitnessed fall.  Pt is stage IV liver CA and wife heard a noise in the bathroom.  When [facility] arrived pt was sitting on the toilet breathing 4-5 x's per minute with bright blood and coffee ground emesis in toilet.  Pt stated emesis x 3.  Blood also on thermometer with rectal temp - temp 94.7.    Pt initially bagged with bvm and then increased to nrb.  Sats now 99% on RA.   En-route, cbg 109, bp 158/102, pulse 85.  Lac to R upper lip, bleeding controlled.  Pt presently ao x 4.

## 2020-09-30 NOTE — ED Provider Notes (Signed)
  [facility] EMERGENCY DEPARTMENT Provider Note      Procedures .Marland KitchenLaceration Repair  Date/Time: 09/30/2020 11:31 AM Performed by: Montine Circle, PA-C Authorized by: Montine Circle, PA-C   Consent:    Consent obtained:  Verbal   Consent given by:  Patient   Risks discussed:  Infection, need for additional repair, pain, poor cosmetic result and poor wound healing   Alternatives discussed:  No treatment and delayed treatment Universal protocol:    Procedure explained and questions answered to patient or proxy's satisfaction: yes     Relevant documents present and verified: yes     Test results available: yes     Imaging studies available: yes     Required blood products, implants, devices, and special equipment available: yes     Site/side marked: yes     Immediately prior to procedure, a time out was called: yes     Patient identity confirmed:  Verbally with patient Anesthesia:    Anesthesia method:  Local infiltration   Local anesthetic:  Lidocaine 1% WITH epi Laceration details:    Location:  Lip   Lip location:  Upper interior lip   Length (cm):  1 Pre-procedure details:    Preparation:  Patient was prepped and draped in usual sterile fashion Exploration:    Hemostasis achieved with:  Direct pressure   Wound exploration: wound explored through full range of motion and entire depth of wound visualized     Contaminated: no   Treatment:    Area cleansed with:  Saline Skin repair:    Repair method:  Sutures   Suture size:  5-0   Suture material:  Fast-absorbing gut   Number of sutures:  4 Approximation:    Approximation:  Close   Vermilion border well-aligned: yes   Repair type:    Repair type:  Simple Post-procedure details:    Dressing:  Open (no dressing)       Montine Circle, PA-C 09/30/20 1132    Lajean Saver, MD 09/30/20 1425

## 2020-09-30 NOTE — ED Provider Notes (Signed)
[facility] EMERGENCY DEPARTMENT Provider Note   CSN: [id number] Arrival date & time: 09/30/20  0750     History Chief Complaint  Patient presents with  . Fall  . GI Bleeding    Ilan Kahrs is a 65 y.o. male.  Patient with progressive, uncurable, hepatocellular ca, with general weakness, fall from toilet, nausea/vomiting (?bloody emesis), blood per rectum. Symptoms acute onset in past day, constant, persistent, severe. EMS notes initially poorly responsive, with shallow breathing - EMS briefly assisted ventilation. EMS noted cbg 109, and bp 158/102.  On arrival to ED pt is hypotensive w bp 73/60, and hypothermic w temp 94.7.  Pt limited historian - level 5 caveat.  Family member notes recent c/o chronic upper abd pain and episodes dark emesis at home yesterday. Pt/spouse deny recent fevers. Pt denies headache. No chest pain. Is in  [place]. Denies numbness/weakness.   The history is provided by the patient and the EMS personnel.  Fall Associated symptoms include abdominal pain. Pertinent negatives include no chest pain, no headaches and no shortness of breath.       Past Medical History:  Diagnosis Date  . Cancer ([place])    stage IV liver CA  . Diabetes mellitus without complication ([place])   . 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 GERD (gastroesophageal reflux disease)   . Glaucoma   . History of hepatitis C    treated  . Hyperlipidemia   . Hypertension     Patient Active Problem List   Diagnosis Date Noted  . Nausea and vomiting 09/21/2020  . Liver cirrhosis ([place]) 07/11/2020    Class: Chronic  . Anemia, unspecified 07/11/2020    Class: Chronic  . History of hepatitis C 07/11/2020    Class: Chronic  . Hepatocellular carcinoma ([place]) 06/18/2020  . Coagulopathy ([place]) 10/23/2019    Class: Chronic  . Iron deficiency anemia 10/23/2019    Class: Chronic  . 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 ROTATOR CUFF REPAIR Right        Family History  Problem Relation Age of Onset  . Pancreatic cancer Brother        died at age 72    Social History   Tobacco Use  . Smoking status: Current Every Day Smoker    Packs/day: 0.50    Years: 52.00    Pack years: 26.00    Start date: 07/11/1968    Last attempt to quit: 04/10/2020    Years since quitting: 0.4  . Smokeless tobacco: Never Used  Substance Use Topics  . Alcohol use: Never  . Drug use: Never    Home Medications Prior to Admission medications   Medication Sig Start Date End Date Taking? Authorizing Provider  LORazepam (ATIVAN) 1 MG tablet TAKE 1 TABLET BY MOUTH EVERY 8 HOURS AS NEEDED FOR ANXIETY 09/15/20   Dayton Scrape A, NP  cetirizine (ALLERGY, CETIRIZINE,) 10 MG tablet Take 1 tablet (10 mg total) by mouth daily. 09/06/20   Dayton Scrape A, NP  diphenoxylate-atropine (LOMOTIL) 2.5-0.025 MG tablet Take by mouth. 02/18/20   [provider]  fluconazole (DIFLUCAN) 150 MG tablet Take 1 tablet (150 mg total) by mouth daily. 08/23/20   Dayton Scrape A, NP  furosemide (LASIX) 20 MG tablet Take 20 mg by mouth daily. 01/26/20   [provider]  HYDROmorphone (DILAUDID) 2 MG  tablet Take 1 tablet (2 mg total) by mouth every 4 (four) hours as needed for severe pain. 09/18/20   Dayton Scrape A, NP  hydrOXYzine (VISTARIL) 50 MG capsule Take 1 capsule (50 mg total) by mouth 3 (three) times daily as needed. 09/18/20   Derwood Kaplan, MD  LENVIMA, 8 MG DAILY DOSE, 2 x 4 MG capsule Take by mouth. 04/14/20   [provider]  loperamide (IMODIUM) 2 MG capsule Take by mouth as needed for diarrhea or loose stools.    [provider]  mirtazapine (REMERON) 30 MG tablet Take 30 mg by mouth at bedtime. 06/13/20   [provider]  nystatin-triamcinolone ointment (MYCOLOG) Apply 1 application topically 2  (two) times daily. 08/23/20   Dayton Scrape A, NP  omeprazole (PRILOSEC) 40 MG capsule Take 40 mg by mouth daily.    [provider]  ondansetron (ZOFRAN) 4 MG tablet Take 4 mg by mouth every 8 (eight) hours as needed for nausea or vomiting.    [provider]  polyethylene glycol (MIRALAX / GLYCOLAX) 17 g packet Take 17 g by mouth daily.    [provider]  prochlorperazine (COMPAZINE) 10 MG tablet Take 10 mg by mouth every 6 (six) hours as needed for nausea or vomiting.    [provider]  sennosides-docusate sodium (SENOKOT-S) 8.6-50 MG tablet Take 1 tablet by mouth daily.    [provider]    Allergies    Hydromorphone and Lortab [hydrocodone-acetaminophen]  Review of Systems   Review of Systems  Constitutional: Negative for fever.  HENT: Negative for sore throat.   Eyes: Negative for redness.  Respiratory: Negative for cough and shortness of breath.   Cardiovascular: Negative for chest pain.  Gastrointestinal: Positive for abdominal pain, nausea and vomiting.  Endocrine: Negative for polyuria.  Genitourinary: Negative for dysuria and flank pain.  Musculoskeletal: Negative for back pain and neck pain.  Skin: Negative for rash.  Neurological: Negative for weakness, numbness and headaches.  Hematological:       No anticoag use.   Psychiatric/Behavioral: Positive for confusion.    Physical Exam Updated Vital Signs BP (!) 73/60   Pulse 85   Temp (!) 94.7 F (34.8 C) (Rectal)   Resp 15   Ht 1.778 m (5\' 10" )   Wt 79.2 kg   SpO2 100%   BMI 25.04 kg/m   Physical Exam Vitals and nursing note reviewed.  Constitutional:      Appearance: Normal appearance. He is well-developed.  HENT:     Head:     Comments: Contusion mouth/lip.    Nose: Nose normal.     Mouth/Throat:     Mouth: Mucous membranes are moist.     Pharynx: Oropharynx is clear.  Eyes:     General: No scleral icterus.    Pupils: Pupils are equal, round, and  reactive to light.  Neck:     Vascular: No carotid bruit.     Trachea: No tracheal deviation.  Cardiovascular:     Rate and Rhythm: Normal rate and regular rhythm.     Pulses: Normal pulses.     Heart sounds: Normal heart sounds. No murmur heard. No friction rub. No gallop.   Pulmonary:     Effort: Pulmonary effort is normal. No accessory muscle usage or respiratory distress.     Breath sounds: Normal breath sounds.  Chest:     Chest wall: No tenderness.  Abdominal:     General: Bowel sounds are normal.  There is no distension.     Palpations: Abdomen is soft. There is mass.     Tenderness: There is no abdominal tenderness. There is no guarding.     Comments: Enlarged liver ?mass  Genitourinary:    Comments: No cva tenderness. Small amount bright red blood on rectal exam - tests heme positive.  Musculoskeletal:        General: No swelling or tenderness.     Cervical back: Normal range of motion and neck supple. No rigidity.     Comments: Mid cervical tenderness, otherwise CTLS spine, non tender, aligned, no step off.   Skin:    General: Skin is warm and dry.     Coloration: Skin is pale.     Findings: No rash.  Neurological:     Mental Status: He is alert.     Comments: Alert, speech clear. Motor/sens grossly intact bil.   Psychiatric:        Mood and Affect: Mood normal.      ED Results / Procedures / Treatments   Labs (all labs ordered are listed, but only abnormal results are displayed) Results for orders placed or performed during the hospital encounter of 09/30/20  Protime-INR  Result Value Ref Range   Prothrombin Time 14.4 11.4 - 15.2 seconds   INR 1.2 0.8 - 1.2  Comprehensive metabolic panel  Result Value Ref Range   Sodium 142 135 - 145 mmol/L   Potassium 4.2 3.5 - 5.1 mmol/L   Chloride 107 98 - 111 mmol/L   CO2 16 (L) 22 - 32 mmol/L   Glucose, Bld 97 70 - 99 mg/dL   BUN 21 8 - 23 mg/dL   Creatinine, Ser 2.02 (H) 0.61 - 1.24 mg/dL   Calcium 8.5 (L) 8.9 -  10.3 mg/dL   Total Protein 4.6 (L) 6.5 - 8.1 g/dL   Albumin 1.7 (L) 3.5 - 5.0 g/dL   AST 194 (H) 15 - 41 U/L   ALT 108 (H) 0 - 44 U/L   Alkaline Phosphatase 217 (H) 38 - 126 U/L   Total Bilirubin 1.5 (H) 0.3 - 1.2 mg/dL   GFR, Estimated 36 (L) >60 mL/min   Anion gap 19 (H) 5 - 15  Lactic acid, plasma  Result Value Ref Range   Lactic Acid, Venous 10.8 (HH) 0.5 - 1.9 mmol/L  Ammonia  Result Value Ref Range   Ammonia 97 (H) 9 - 35 umol/L  CBC with Differential/Platelet  Result Value Ref Range   WBC 11.7 (H) 4.0 - 10.5 K/uL   RBC 1.99 (L) 4.22 - 5.81 MIL/uL   Hemoglobin 6.1 (LL) 13.0 - 17.0 g/dL   HCT 20.1 (L) 39.0 - 52.0 %   MCV 101.0 (H) 80.0 - 100.0 fL   MCH 30.7 26.0 - 34.0 pg   MCHC 30.3 30.0 - 36.0 g/dL   RDW 19.9 (H) 11.5 - 15.5 %   Platelets 283 150 - 400 K/uL   nRBC 0.3 (H) 0.0 - 0.2 %   Neutrophils Relative % 71 %   Neutro Abs 8.2 (H) 1.7 - 7.7 K/uL   Lymphocytes Relative 17 %   Lymphs Abs 2.0 0.7 - 4.0 K/uL   Monocytes Relative 9 %   Monocytes Absolute 1.0 0.1 - 1.0 K/uL   Eosinophils Relative 0 %   Eosinophils Absolute 0.0 0.0 - 0.5 K/uL   Basophils Relative 0 %   Basophils Absolute 0.0 0.0 - 0.1 K/uL   Immature Granulocytes 3 %   Abs  Immature Granulocytes 0.37 (H) 0.00 - 0.07 K/uL  Lipase, blood  Result Value Ref Range   Lipase 12 11 - 51 U/L  POC occult blood, ED  Result Value Ref Range   Fecal Occult Bld POSITIVE (A) NEGATIVE  Type and screen  Result Value Ref Range   ABO/RH(D) PENDING    Antibody Screen PENDING    Sample Expiration      10/03/2020,2359 Performed at [facility] [address], [place], [place] [address]   ABO/Rh  Result Value Ref Range   ABO/RH(D)      O POS Performed at [facility] [address] [place], [place] [address]    CT HEAD WO CONTRAST  Result Date: 09/30/2020 CLINICAL DATA:  Unwitnessed fall, syncope, head and neck injury, history of hepatocellular carcinoma EXAM: CT HEAD WITHOUT CONTRAST CT  CERVICAL SPINE WITHOUT CONTRAST TECHNIQUE: Multidetector CT imaging of the head and cervical spine was performed following the standard protocol without intravenous contrast. Multiplanar CT image reconstructions of the cervical spine were also generated. COMPARISON:  10/12/2019 head CT FINDINGS: CT HEAD FINDINGS Brain: Mild chronic white matter microvascular ischemic changes about the lateral ventricles. No acute intracranial hemorrhage, mass lesion, new infarction, midline shift, herniation, hydrocephalus, or extra-axial fluid collection. No focal mass effect or edema. Cisterns are patent. No gross cerebellar abnormality. Vascular: No hyperdense vessel or unexpected calcification. Skull: Normal. Negative for fracture or focal lesion. Sinuses/Orbits: Orbits unremarkable. Scattered mild sinus mucosal thickening. Other: None. CT CERVICAL SPINE FINDINGS Alignment: Normal. Skull base and vertebrae: No acute fracture. No primary bone lesion or focal pathologic process. Soft tissues and spinal canal: Normal prevertebral soft tissues. Multifactorial mild acquired spinal stenosis at C4-5, C5-6, and C6-7. Disc levels: Mild mid and lower cervical degenerative disc disease. Facets are aligned. No subluxation or dislocation. Upper chest: Apical emphysema noted. Other: Right IJ port catheter present. Carotid and aortic atherosclerosis present. IMPRESSION: No acute intracranial abnormality by noncontrast CT. Stable minor white matter microvascular ischemic changes. Cervical degenerative changes as above. No acute cervical spine fracture or malalignment by CT. Aortic Atherosclerosis (ICD10-I70.0) and Emphysema (ICD10-J43.9). Electronically Signed   By: Jerilynn Mages.  Shick M.D.   On: 09/30/2020 09:41   CT CERVICAL SPINE WO CONTRAST  Result Date: 09/30/2020 CLINICAL DATA:  Unwitnessed fall, syncope, head and neck injury, history of hepatocellular carcinoma EXAM: CT HEAD WITHOUT CONTRAST CT CERVICAL SPINE WITHOUT CONTRAST TECHNIQUE:  Multidetector CT imaging of the head and cervical spine was performed following the standard protocol without intravenous contrast. Multiplanar CT image reconstructions of the cervical spine were also generated. COMPARISON:  10/12/2019 head CT FINDINGS: CT HEAD FINDINGS Brain: Mild chronic white matter microvascular ischemic changes about the lateral ventricles. No acute intracranial hemorrhage, mass lesion, new infarction, midline shift, herniation, hydrocephalus, or extra-axial fluid collection. No focal mass effect or edema. Cisterns are patent. No gross cerebellar abnormality. Vascular: No hyperdense vessel or unexpected calcification. Skull: Normal. Negative for fracture or focal lesion. Sinuses/Orbits: Orbits unremarkable. Scattered mild sinus mucosal thickening. Other: None. CT CERVICAL SPINE FINDINGS Alignment: Normal. Skull base and vertebrae: No acute fracture. No primary bone lesion or focal pathologic process. Soft tissues and spinal canal: Normal prevertebral soft tissues. Multifactorial mild acquired spinal stenosis at C4-5, C5-6, and C6-7. Disc levels: Mild mid and lower cervical degenerative disc disease. Facets are aligned. No subluxation or dislocation. Upper chest: Apical emphysema noted. Other: Right IJ port catheter present. Carotid and aortic atherosclerosis present. IMPRESSION: No acute intracranial abnormality by noncontrast  CT. Stable minor white matter microvascular ischemic changes. Cervical degenerative changes as above. No acute cervical spine fracture or malalignment by CT. Aortic Atherosclerosis (ICD10-I70.0) and Emphysema (ICD10-J43.9). Electronically Signed   By: Judie PetitM.  Shick M.D.   On: 09/30/2020 09:41   DG Chest Port 1 View  Result Date: 09/30/2020 CLINICAL DATA:  Weakness EXAM: PORTABLE CHEST 1 VIEW COMPARISON:  07/20/2020 FINDINGS: Porta catheter with tip at the SVC. Streaky density at the bases which is improved from before and remaining likely reflecting scarring. There is no  edema, consolidation, effusion, or pneumothorax. Extensive artifact from EKG leads. IMPRESSION: No acute finding. Mild scarring at the lung bases. Electronically Signed   By: Marnee SpringJonathon  Watts M.D.   On: 09/30/2020 09:38    ED ECG REPORT   Date: 09/30/2020  Rate: 91  Rhythm: normal sinus rhythm  QRS Axis: normal  Intervals: normal  ST/T Wave abnormalities: normal  Conduction Disutrbances: prolonged qt  Narrative Interpretation:   Old EKG Reviewed: none available and changes noted  I have personally reviewed the EKG tracing  Radiology CT HEAD WO CONTRAST  Result Date: 09/30/2020 CLINICAL DATA:  Unwitnessed fall, syncope, head and neck injury, history of hepatocellular carcinoma EXAM: CT HEAD WITHOUT CONTRAST CT CERVICAL SPINE WITHOUT CONTRAST TECHNIQUE: Multidetector CT imaging of the head and cervical spine was performed following the standard protocol without intravenous contrast. Multiplanar CT image reconstructions of the cervical spine were also generated. COMPARISON:  10/12/2019 head CT FINDINGS: CT HEAD FINDINGS Brain: Mild chronic white matter microvascular ischemic changes about the lateral ventricles. No acute intracranial hemorrhage, mass lesion, new infarction, midline shift, herniation, hydrocephalus, or extra-axial fluid collection. No focal mass effect or edema. Cisterns are patent. No gross cerebellar abnormality. Vascular: No hyperdense vessel or unexpected calcification. Skull: Normal. Negative for fracture or focal lesion. Sinuses/Orbits: Orbits unremarkable. Scattered mild sinus mucosal thickening. Other: None. CT CERVICAL SPINE FINDINGS Alignment: Normal. Skull base and vertebrae: No acute fracture. No primary bone lesion or focal pathologic process. Soft tissues and spinal canal: Normal prevertebral soft tissues. Multifactorial mild acquired spinal stenosis at C4-5, C5-6, and C6-7. Disc levels: Mild mid and lower cervical degenerative disc disease. Facets are aligned. No  subluxation or dislocation. Upper chest: Apical emphysema noted. Other: Right IJ port catheter present. Carotid and aortic atherosclerosis present. IMPRESSION: No acute intracranial abnormality by noncontrast CT. Stable minor white matter microvascular ischemic changes. Cervical degenerative changes as above. No acute cervical spine fracture or malalignment by CT. Aortic Atherosclerosis (ICD10-I70.0) and Emphysema (ICD10-J43.9). Electronically Signed   By: Judie PetitM.  Shick M.D.   On: 09/30/2020 09:41   CT CERVICAL SPINE WO CONTRAST  Result Date: 09/30/2020 CLINICAL DATA:  Unwitnessed fall, syncope, head and neck injury, history of hepatocellular carcinoma EXAM: CT HEAD WITHOUT CONTRAST CT CERVICAL SPINE WITHOUT CONTRAST TECHNIQUE: Multidetector CT imaging of the head and cervical spine was performed following the standard protocol without intravenous contrast. Multiplanar CT image reconstructions of the cervical spine were also generated. COMPARISON:  10/12/2019 head CT FINDINGS: CT HEAD FINDINGS Brain: Mild chronic white matter microvascular ischemic changes about the lateral ventricles. No acute intracranial hemorrhage, mass lesion, new infarction, midline shift, herniation, hydrocephalus, or extra-axial fluid collection. No focal mass effect or edema. Cisterns are patent. No gross cerebellar abnormality. Vascular: No hyperdense vessel or unexpected calcification. Skull: Normal. Negative for fracture or focal lesion. Sinuses/Orbits: Orbits unremarkable. Scattered mild sinus mucosal thickening. Other: None. CT CERVICAL SPINE FINDINGS Alignment: Normal. Skull base and vertebrae: No acute fracture. No primary bone  lesion or focal pathologic process. Soft tissues and spinal canal: Normal prevertebral soft tissues. Multifactorial mild acquired spinal stenosis at C4-5, C5-6, and C6-7. Disc levels: Mild mid and lower cervical degenerative disc disease. Facets are aligned. No subluxation or dislocation. Upper chest: Apical  emphysema noted. Other: Right IJ port catheter present. Carotid and aortic atherosclerosis present. IMPRESSION: No acute intracranial abnormality by noncontrast CT. Stable minor white matter microvascular ischemic changes. Cervical degenerative changes as above. No acute cervical spine fracture or malalignment by CT. Aortic Atherosclerosis (ICD10-I70.0) and Emphysema (ICD10-J43.9). Electronically Signed   By: Jerilynn Mages.  Shick M.D.   On: 09/30/2020 09:41   DG Chest Port 1 View  Result Date: 09/30/2020 CLINICAL DATA:  Weakness EXAM: PORTABLE CHEST 1 VIEW COMPARISON:  07/20/2020 FINDINGS: Porta catheter with tip at the SVC. Streaky density at the bases which is improved from before and remaining likely reflecting scarring. There is no edema, consolidation, effusion, or pneumothorax. Extensive artifact from EKG leads. IMPRESSION: No acute finding. Mild scarring at the lung bases. Electronically Signed   By: Monte Fantasia M.D.   On: 09/30/2020 09:38    Procedures Procedures (including critical care time)  Medications Ordered in ED Medications  lactated ringers bolus 2,376 mL (has no administration in time range)    ED Course  I have reviewed the triage vital signs and the nursing notes.  Pertinent labs & imaging results that were available during my care of the patient were reviewed by me and considered in my medical decision making (see chart for details).    MDM Rules/Calculators/A&P                         Iv ns. Continuous pulse ox and cardiac monitoring. Stat labs and imaging. Cultures.   Temp low, bair hugger applied.   BP low, LR boluses.   Cultures sent. Iv abx.   Reviewed nursing notes and prior charts for additional history.  Recent heme/onc notes reviewed - ?unclear why no prior goals of therapy/life, palliative care discussion or consultation - in ED, initiated conversation w patient/spouse, and palliative consult placed.   MDM Number of Diagnoses or Management Options   Amount  and/or Complexity of Data Reviewed Clinical lab tests: ordered and reviewed Tests in the radiology section of CPT: ordered and reviewed Tests in the medicine section of CPT: ordered and reviewed Discussion of test results with the performing providers: yes Decide to obtain previous medical records or to obtain history from someone other than the patient: yes Obtain history from someone other than the patient: yes Review and summarize past medical records: yes Discuss the patient with other providers: yes Independent visualization of images, tracings, or specimens: yes  Risk of Complications, Morbidity, and/or Mortality Presenting problems: high Diagnostic procedures: high Management options: high  Labs reviewed/interpreted by me - hgb low, heme pos stool. Transfuse 2 units prbc.   Protonix bolus and gtt..   CT reviewed/interpreted by me - no hem.  CXR reviewed/interpreted by me - no pna.   Additional labs reviewed/interpreted by me - lactate is v high. Abd soft nt. Afebrile. Suspect due to volume depletion, severe anemia/gi bleed, and not sepsis. Additional ivf bolus, transfusion prbc.  bp is improved from prior.   Unassigned medicine consulted for admission - discussed with [facility] - will admit, and requests gi consult. Gi consulted.   CRITICAL CARE RE: GI bleed with hypotension requiring emergent transfusion prbc, metastatic hepatocellular ca with severe prot-cal malnutrition, AKI Performed  by: Mirna Mires Total critical care time: 150 minutes Critical care time was exclusive of separately billable procedures and treating other patients. Critical care was necessary to treat or prevent imminent or life-threatening deterioration. Critical care was time spent personally by me on the following activities: development of treatment plan with patient and/or surrogate as well as nursing, discussions with consultants, evaluation of patient's response to treatment, examination of patient,  obtaining history from patient or surrogate, ordering and performing treatments and interventions, ordering and review of laboratory studies, ordering and review of radiographic studies, pulse oximetry and re-evaluation of patient's condition.  Discussed pt with gi - they will see in consult.      Final Clinical Impression(s) / ED Diagnoses Final diagnoses:  None    Rx / DC Orders ED Discharge Orders    None       Lajean Saver, MD 09/30/20 1054

## 2020-09-30 NOTE — ED Notes (Signed)
PT to CT.

## 2020-09-30 NOTE — H&P (View-Only) (Signed)
[facility] Consult: 11:07 AM 09/30/2020  LOS: 0 days    Referring Provider: Dr. Ashok Cordia in the ED Primary Care Physician:  Cyndi Bender, PA-C Primary Gastroenterologist: None    Reason for Consultation: Dark emesis, bleeding per rectum.   HPI: Scott Farmer is a 65 y.o. male.  PMH hep C, treated.  Denies history cirrhosis.  Stage IV liver cancer under chemotherapy treatment by Dr. Hinton Rao at the cancer center in [place].  Emphysema.  Small epidural hematoma 07/2020 following a fall. Colonoscopy a few years ago when he was living in [place].  Does not recall any diagnosis of polyps or diverticulosis.  Stage IIIa (T3 N0 M0) multifocal hepatocellular carcinoma diagnosed 10/2019 after routine labs revealed abnormal LFTs and subsequent imaging.  Spent question of IVC thrombus on imaging. Initiated palliative treatment under the care of Dr. Hinton Rao.  Initially treated with palliative atezolizumab but this was stopped due to progressive disease, did not tolerate lenvatinib started in June.  Transitioned to ipilimumab, nivolumab He is on maintenance nivolumab, last treatment 1/13.  Being treated for pruritus with Zyrtec, Claritin, Benadryl, Vistaril.  Pain management with MS Contin and Dilaudid. Additional hematologic issue includes IDA treated with IV iron which she has not required since March.  Coagulopathy.  2 to 3 days of bloody bowel movements.  Some nonfocal abdominal pain that comes and goes and is relieved after vomiting.  Vomiting that was initially bilious but turned dark, coffee colored.  Vomiting this morning at 4 AM and had a syncopal spell with trauma and laceration to his lip.  Brought to the ED. Generally his appetite is poor but nausea and vomiting are not something he has had recently.  Has  never seen bloody stools.  Has not been using ibuprofen in a few weeks and if he does it is only in small doses.  Quit alcohol drinking several years ago.   Hgb 6.1, was 11.612 days ago and ranges 10-11 in recent months. WBCs 11.7.  Platelets 280.  Normal INR Elevated creatinine, AKI BUN just 21.  Reviewed LFTs and alk phos had been in the 400s, it is 217.  T bili is 1.5.  Transaminases 194/108, up from previous.  Venous lactic acid 10.8 FOBT positive CA 19-9 was 795 on 09/18/2020 No acute changes on CT head and cervical spine, CXR  Blood pressure 96 3/73, pulse 76 currently Temperature as low as 94 and warming blanket applied. Protonix drip initiated and patient received several boluses of LR.     Past Medical History:  Diagnosis Date  . Cancer ([place])    stage IV liver CA  . Diabetes mellitus without complication ([place])   . 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 GERD (gastroesophageal reflux disease)   . Glaucoma   . History of hepatitis C    treated  . Hyperlipidemia   . Hypertension     Past Surgical History:  Procedure Laterality Date  . ROTATOR CUFF REPAIR Right     Prior to Admission medications   Medication Sig Start Date End Date  Taking? Authorizing Provider  hydrOXYzine (VISTARIL) 50 MG capsule Take 1 capsule (50 mg total) by mouth 3 (three) times daily as needed. Patient taking differently: Take 50 mg by mouth 3 (three) times daily as needed for itching. 09/18/20  Yes Derwood Kaplan, MD  LORazepam (ATIVAN) 1 MG tablet TAKE 1 TABLET BY MOUTH EVERY 8 HOURS AS NEEDED FOR ANXIETY Patient not taking: No sig reported 09/15/20   Dayton Scrape A, NP  Oxycodone HCl 10 MG TABS Take 10 mg by mouth every 4 (four) hours as needed (pain).   Yes [provider]  cetirizine (ALLERGY, CETIRIZINE,) 10 MG tablet Take 1 tablet (10 mg total) by mouth daily. Patient not taking: No sig reported 09/06/20   Dayton Scrape A, NP  HYDROmorphone (DILAUDID) 2 MG tablet Take  1 tablet (2 mg total) by mouth every 4 (four) hours as needed for severe pain. Patient not taking: Reported on 09/30/2020 09/18/20   Melodye Ped, NP  nystatin-triamcinolone ointment [facility]) Apply 1 application topically 2 (two) times daily. Patient not taking: No sig reported 08/23/20   Dayton Scrape A, NP    Scheduled Meds: . lidocaine-EPINEPHrine  10 mL Infiltration Once  . Tdap  0.5 mL Intramuscular Once   Infusions: . sodium chloride    . pantoprozole (PROTONIX) infusion 8 mg/hr (09/30/20 1025)   PRN Meds:    Allergies as of 09/30/2020 - Review Complete 09/30/2020  Allergen Reaction Noted  . Hydromorphone Itching 06/18/2020  . Lortab [hydrocodone-acetaminophen] Itching 06/18/2020    Family History  Problem Relation Age of Onset  . Pancreatic cancer Brother        died at age 60    Social History   Socioeconomic History  . Marital status: Married    Spouse name: Not on file  . Number of children: Not on file  . Years of education: Not on file  . Highest education level: Not on file  Occupational History  . Not on file  Tobacco Use  . Smoking status: Current Every Day Smoker    Packs/day: 0.50    Years: 52.00    Pack years: 26.00    Start date: 07/11/1968    Last attempt to quit: 04/10/2020    Years since quitting: 0.4  . Smokeless tobacco: Never Used  Substance and Sexual Activity  . Alcohol use: Never  . Drug use: Never  . Sexual activity: Not Currently  Other Topics Concern  . Not on file  Social History Narrative  . Not on file   Social Determinants of Health   Financial Resource Strain: Not on file  Food Insecurity: Not on file  Transportation Needs: Not on file  Physical Activity: Not on file  Stress: Not on file  Social Connections: Not on file  Intimate Partner Violence: Not on file    REVIEW OF SYSTEMS: Constitutional: Weakness ENT:  No nose bleeds Pulm: No shortness of breath or cough CV:  No palpitations, no LE edema.  No  angina GU:  No hematuria, no frequency GI: See HPI. Heme: No unusual or excessive bleeding other than the bleeding per rectum in the last few days as well as bleeding from the laceration on his mouth today. Transfusions: None past Neuro:  No headaches, no peripheral tingling or numbness Derm:  No itching, no rash or sores.  Endocrine: Feels chills.  No polyuria or dysuria Immunization: Not queried Travel:  None beyond local counties in last few months.    PHYSICAL EXAM: Vital signs  in last 24 hours: Vitals:   09/30/20 0845 09/30/20 0942  BP: (!) 84/57 (!) 95/57  Pulse: 71 77  Resp: 10 20  Temp:  (!) 94 F (34.4 C)  SpO2: 100% 100%   Wt Readings from Last 3 Encounters:  09/30/20 79.2 kg  09/21/20 79.2 kg  09/18/20 81.4 kg    General: Pleasant, comfortable, looks moderately ill.  No distress. Head: No facial bruising.  There is a deep laceration in the lower right side of his lip that is bloody but not oozing blood any longer.  Tongue is midline. Eyes: No scleral icterus.  No conjunctival pallor. Ears: Hard of hearing Nose: No discharge Mouth: Lip laceration as above Neck: No JVD, no masses, no thyromegaly Lungs: Clear bilaterally.  No labored breathing.  Port-A-Cath positioned in the upper right chest Heart: RRR.  No MRG.  S1, S2 present Abdomen: Soft, not distended.  Active bowel sounds.  Minor tenderness over in the left abdomen..   Rectal: Bloody material on glove.  No masses.  No hemorrhoids Musc/Skeltl: No joint redness or swelling Extremities: Slight, nonpitting lower extremity/pedal edema Neurologic: Alert.  Appropriate.  Oriented x3.  Moves all 4 limbs.  No tremors. Skin: No rash or open sores Nodes: No cervical adenopathy Psych: Calm, cooperative  Intake/Output from previous day: No intake/output data recorded. Intake/Output this shift: No intake/output data recorded.  LAB RESULTS: Recent Labs    09/30/20 0826  WBC 11.7*  HGB 6.1*  HCT 20.1*  PLT 283    BMET Lab Results  Component Value Date   NA 142 09/30/2020   NA 138 09/18/2020   NA 138 08/21/2020   K 4.2 09/30/2020   K 4.0 09/18/2020   K 4.0 08/21/2020   CL 107 09/30/2020   CL 108 09/18/2020   CL 111 (A) 08/21/2020   CO2 16 (L) 09/30/2020   CO2 24 (A) 09/18/2020   CO2 20 08/21/2020   GLUCOSE 97 09/30/2020   BUN 21 09/30/2020   BUN 12 09/18/2020   BUN 16 08/21/2020   CREATININE 2.02 (H) 09/30/2020   CREATININE 1.4 (A) 09/18/2020   CREATININE 1.5 (A) 08/21/2020   CALCIUM 8.5 (L) 09/30/2020   CALCIUM 9.9 09/18/2020   CALCIUM 9.1 08/21/2020   LFT Recent Labs    09/30/20 0826  PROT 4.6*  ALBUMIN 1.7*  AST 194*  ALT 108*  ALKPHOS 217*  BILITOT 1.5*   PT/INR Lab Results  Component Value Date   INR 1.2 09/30/2020   Hepatitis Panel No results for input(s): HEPBSAG, HCVAB, HEPAIGM, HEPBIGM in the last 72 hours. C-Diff No components found for: CDIFF Lipase     Component Value Date/Time   LIPASE 12 09/30/2020 0826   COVID-negative   Drugs of Abuse  No results found for: LABOPIA, COCAINSCRNUR, LABBENZ, AMPHETMU, THCU, LABBARB   RADIOLOGY STUDIES: CT HEAD WO CONTRAST  Result Date: 09/30/2020 CLINICAL DATA:  Unwitnessed fall, syncope, head and neck injury, history of hepatocellular carcinoma EXAM: CT HEAD WITHOUT CONTRAST CT CERVICAL SPINE WITHOUT CONTRAST TECHNIQUE: Multidetector CT imaging of the head and cervical spine was performed following the standard protocol without intravenous contrast. Multiplanar CT image reconstructions of the cervical spine were also generated. COMPARISON:  10/12/2019 head CT FINDINGS: CT HEAD FINDINGS Brain: Mild chronic white matter microvascular ischemic changes about the lateral ventricles. No acute intracranial hemorrhage, mass lesion, new infarction, midline shift, herniation, hydrocephalus, or extra-axial fluid collection. No focal mass effect or edema. Cisterns are patent. No gross cerebellar abnormality. Vascular:  No  hyperdense vessel or unexpected calcification. Skull: Normal. Negative for fracture or focal lesion. Sinuses/Orbits: Orbits unremarkable. Scattered mild sinus mucosal thickening. Other: None. CT CERVICAL SPINE FINDINGS Alignment: Normal. Skull base and vertebrae: No acute fracture. No primary bone lesion or focal pathologic process. Soft tissues and spinal canal: Normal prevertebral soft tissues. Multifactorial mild acquired spinal stenosis at C4-5, C5-6, and C6-7. Disc levels: Mild mid and lower cervical degenerative disc disease. Facets are aligned. No subluxation or dislocation. Upper chest: Apical emphysema noted. Other: Right IJ port catheter present. Carotid and aortic atherosclerosis present. IMPRESSION: No acute intracranial abnormality by noncontrast CT. Stable minor white matter microvascular ischemic changes. Cervical degenerative changes as above. No acute cervical spine fracture or malalignment by CT. Aortic Atherosclerosis (ICD10-I70.0) and Emphysema (ICD10-J43.9). Electronically Signed   By: Jerilynn Mages.  Shick M.D.   On: 09/30/2020 09:41   CT CERVICAL SPINE WO CONTRAST  Result Date: 09/30/2020 CLINICAL DATA:  Unwitnessed fall, syncope, head and neck injury, history of hepatocellular carcinoma EXAM: CT HEAD WITHOUT CONTRAST CT CERVICAL SPINE WITHOUT CONTRAST TECHNIQUE: Multidetector CT imaging of the head and cervical spine was performed following the standard protocol without intravenous contrast. Multiplanar CT image reconstructions of the cervical spine were also generated. COMPARISON:  10/12/2019 head CT FINDINGS: CT HEAD FINDINGS Brain: Mild chronic white matter microvascular ischemic changes about the lateral ventricles. No acute intracranial hemorrhage, mass lesion, new infarction, midline shift, herniation, hydrocephalus, or extra-axial fluid collection. No focal mass effect or edema. Cisterns are patent. No gross cerebellar abnormality. Vascular: No hyperdense vessel or unexpected calcification.  Skull: Normal. Negative for fracture or focal lesion. Sinuses/Orbits: Orbits unremarkable. Scattered mild sinus mucosal thickening. Other: None. CT CERVICAL SPINE FINDINGS Alignment: Normal. Skull base and vertebrae: No acute fracture. No primary bone lesion or focal pathologic process. Soft tissues and spinal canal: Normal prevertebral soft tissues. Multifactorial mild acquired spinal stenosis at C4-5, C5-6, and C6-7. Disc levels: Mild mid and lower cervical degenerative disc disease. Facets are aligned. No subluxation or dislocation. Upper chest: Apical emphysema noted. Other: Right IJ port catheter present. Carotid and aortic atherosclerosis present. IMPRESSION: No acute intracranial abnormality by noncontrast CT. Stable minor white matter microvascular ischemic changes. Cervical degenerative changes as above. No acute cervical spine fracture or malalignment by CT. Aortic Atherosclerosis (ICD10-I70.0) and Emphysema (ICD10-J43.9). Electronically Signed   By: Jerilynn Mages.  Shick M.D.   On: 09/30/2020 09:41   DG Chest Port 1 View  Result Date: 09/30/2020 CLINICAL DATA:  Weakness EXAM: PORTABLE CHEST 1 VIEW COMPARISON:  07/20/2020 FINDINGS: Porta catheter with tip at the SVC. Streaky density at the bases which is improved from before and remaining likely reflecting scarring. There is no edema, consolidation, effusion, or pneumothorax. Extensive artifact from EKG leads. IMPRESSION: No acute finding. Mild scarring at the lung bases. Electronically Signed   By: Monte Fantasia M.D.   On: 09/30/2020 09:38      IMPRESSION:   *   Hematochezia, dark emesis.  *    Hepatocellular carcinoma under treatment with Dr. Hinton Rao in [place].  *     Blood loss anemia.  2 PRBCs ordered, infusions not yet started.    PLAN:     *    Per Dr. Loletha Carrow.  Patient likely will need colonoscopy and upper endoscopy. Allow sips of clear liquids as desired. Follow blood counts, additional PRBCs as indicated. Palliative care consult  has been ordered by the ED physician.   Azucena Freed  09/30/2020, 11:07 AM Phone [phone]  ____________________________________________________________  I have reviewed the entire case in detail with the above APP and discussed the plan in detail.  Therefore, I agree with the diagnoses recorded above. In addition,  I have personally interviewed and examined the patient.  My additional thoughts are as follows: (Additional physical exam finding is that the left lobe of his liver is enlarged tender and pulsatile)  Complex patient with advanced hepatocellular cancer now with GI bleeding of unclear source.  He has had progressive nausea and vomiting in recent weeks as well as reported dark emesis and now blood per rectum.  It is difficult to tell if this is upper GI bleeding, lower or both, but I am concerned that is primarily upper. It sounds like he has been bleeding for perhaps the last 2 weeks, and his hemoglobin is down over 5 g in less than 2 weeks.  Given his known advanced HCC, this could be hemobilia from tumor bleeding.  He could also have had capsular rupture and bleeding into the peritoneum, and he does have some lower abdominal tenderness.  He does not have an acute abdomen, and I think his elevated venous lactate is from hypotension and hypoperfusion.  He has acute on chronic kidney disease, precluding use of IV contrast for either standard CT scan or CT angio.  Blood pressure remains low with a systolic 27-741 and heart rate 90-100 as well during my evaluation.  He is just finishing his first unit of PRBCs for marked anemia with a hemoglobin of 6.  My plan is as follows: Continue Protonix drip Total 2 units PRBCs, then check posttransfusion H&H 2 hours after and continue to transfuse to keep hemoglobin over 7.  Every 6 hour hemoglobin and hematocrit thereafter.  Sips of water and ice chips until midnight, then n.p.o.  Noncontrast CT scan abdomen and pelvis, mainly to see if  there is hemoperitoneum.  Upper endoscopy and colonoscopy tomorrow morning.  Given his weakened condition and hypotension, I am not sure how much bowel prep you will be able to tolerate.  He says he is willing to give it is best to try.  We we will definitely do the EGD, even if we can only do a limited colonoscopy if insufficient prep.  These procedures were described in detail along with risks and benefits in the presence of his ED nurse and he was agreeable.  The benefits and risks of the planned procedure were described in detail with the patient or (when appropriate) their health care proxy.  Risks were outlined as including, but not limited to, bleeding, infection, perforation, adverse medication reaction leading to cardiac or pulmonary decompensation, pancreatitis (if ERCP).  The limitation of incomplete mucosal visualization was also discussed.  No guarantees or warranties were given.  Patient at increased risk for cardiopulmonary complications of procedure due to medical comorbidities.   Prognosis poor given known progressive advanced HCC and now significant GI bleeding.  (Complex medical decision making and increased risk procedure)  Nelida Meuse III Office:[phone]

## 2020-09-30 NOTE — H&P (Signed)
Date: 09/30/2020               Scott Farmer Name:  Scott Farmer MRN: [id number]  DOB: Jul 16, 1956 Age / Sex: 65 y.o., male   PCP: Cyndi Bender, PA-C         Medical Service: Internal Medicine Teaching Service         Attending Physician: Dr. Jimmye Norman, Elaina Pattee, MD    First Contact: Dr. Johnney Ou Pager: [phone]  Second Contact: Dr. Gilford Rile Pager: [phone]       After Hours (After 5p/  First Contact Pager: [phone]  weekends / holidays): Second Contact Pager: [phone]   Chief Complaint: Syncopal Episode  History of Present Illness:   Scott Farmer is a 39 M with a PMHx of Stg IIIA (T3N0M0) multifocal hepatocellular carcinoma dx Feb 2021 currently treated with palliative chemotherapy 4x a month, Hx treated Hep C., T2DM, HTN, HLD, GERD who presented to the ED from EMS after having worsening weakness, poor appetite, dark stools the past few days an episode of bright red blood and dark coffee ground vomiting with unwitnessed syncopal episode PTA. Scott Farmer notes Scott Farmer was going to the bathroom, had a large bowel movement, and the next thing Scott Farmer remembers is EMS asking him questions. Notes an unwitnessed fall, his wife found him in the bathroom with bright red blood and coffee ground emesis in the toilet. Scott Farmer was scheduled to have an appointment with his oncologist yesterday to be evaluated for his n/v, but canceled due to the winter weather.   Endorses blurry vision (chronic), poor appetite, chest pain that comes and goes, shortness of breath (when moving), chronic back pain, lower limb swelling, LUE numbness, bilateral lower extremity numbness (Chronic)   Denies: Headache, dysuria, hematuria  Scott Farmer endorses overall an acute decline over the past year and states Scott Farmer was in good health prior to the liver cancer diagnosis. Scott Farmer denies having goals of care conversations in the past. Scott Farmer receives treatment 4x a month at the cancer center in [place].   At the time of our conversation, Scott Farmer did endorse  wanting to be a full code. I discussed with him palliative was consulted and they would be talking with him and his family to discuss his chronic disease and goals of care. I further discussed with him that someone in his state of health and with such severe malignancy, may benefit from discussing goals of care with palliative. Scott Farmer voices understand, will meet with palliative, but would like to remain full code for now.   ED Course:  Scott Farmer hypotensive and hypothermic. IV LR boluses and bair hugger applied. PRBC ordered. Palliative consult placed.   Lab Orders     Blood culture (routine x 2)     SARS CORONAVIRUS 2 (TAT 6-24 HRS) Nasopharyngeal Nasopharyngeal Swab     Protime-INR     Comprehensive metabolic panel     Lactic acid, plasma     Ammonia     CBC with Differential/Platelet     Lipase, blood     Lactic acid, plasma     HIV Antibody (routine testing w rflx)     Basic metabolic panel     CBC     POC occult blood, ED   Meds:  Current Meds  Medication Sig  . hydrOXYzine (VISTARIL) 50 MG capsule Take 1 capsule (50 mg total) by mouth 3 (three) times daily as needed. (Scott Farmer taking differently: Take 50 mg by mouth 3 (three) times daily as needed for itching.)  .  Oxycodone HCl 10 MG TABS Take 10 mg by mouth every 4 (four) hours as needed (pain).    Social:  Lives at home with wife Tobacco: Current every day smoker ETOH: none Drugs: none  Family History:  Cancer: Brother passed [place]), Brother (prostate)   Allergies: Allergies as of 09/30/2020 - Review Complete 09/30/2020  Allergen Reaction Noted  . Hydromorphone Itching 06/18/2020  . Lortab [hydrocodone-acetaminophen] Itching 06/18/2020   Past Medical History:  Diagnosis Date  . Cancer ([place])    stage IV liver CA  . Diabetes mellitus without complication ([place])   . 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 GERD (gastroesophageal reflux disease)   . Glaucoma   . History of hepatitis C    treated  .  Hyperlipidemia   . Hypertension      Review of Systems: A complete ROS was negative except as per HPI.   Physical Exam: Blood pressure 118/69, pulse 85, temperature 98.1 F (36.7 C), resp. rate 13, height 5\' 10"  (1.778 m), weight 79.2 kg, SpO2 99 %. Physical Exam Constitutional:      Appearance: Scott Farmer is ill-appearing.     Comments: frail  HENT:     Head:     Comments: Temporal wasting, sutures on R cheek Eyes:     General: Scleral icterus present.  Cardiovascular:     Rate and Rhythm: Normal rate and regular rhythm.     Pulses: Normal pulses.     Heart sounds: Normal heart sounds. No murmur heard. No friction rub. No gallop.   Pulmonary:     Effort: Pulmonary effort is normal. No respiratory distress.     Breath sounds: Normal breath sounds. No wheezing, rhonchi or rales.     Comments: R. Sided chest port well bandaged, no erythema, purulence, or drainage.  Abdominal:     Tenderness: There is abdominal tenderness.     Comments: Tender to palpation in the Epigastrium   Musculoskeletal:     Right lower leg: No edema.     Left lower leg: No edema.  Skin:    General: Skin is dry.     Comments: cool  Neurological:     General: No focal deficit present.     Mental Status: Scott Farmer is alert and oriented to person, place, and time. Mental status is at baseline.     Motor: Weakness (diffuse) present.  Psychiatric:        Mood and Affect: Mood normal.        Behavior: Behavior normal.     Labs: CBC    Component Value Date/Time   WBC 11.7 (H) 09/30/2020 0826   RBC 1.99 (L) 09/30/2020 0826   HGB 6.1 (LL) 09/30/2020 0826   HCT 20.1 (L) 09/30/2020 0826   PLT 283 09/30/2020 0826   MCV 101.0 (H) 09/30/2020 0826   MCH 30.7 09/30/2020 0826   MCHC 30.3 09/30/2020 0826   RDW 19.9 (H) 09/30/2020 0826   LYMPHSABS 2.0 09/30/2020 0826   MONOABS 1.0 09/30/2020 0826   EOSABS 0.0 09/30/2020 0826   BASOSABS 0.0 09/30/2020 0826     CMP     Component Value Date/Time   NA 142  09/30/2020 0826   NA 138 09/18/2020 0000   K 4.2 09/30/2020 0826   CL 107 09/30/2020 0826   CO2 16 (L) 09/30/2020 0826   GLUCOSE 97 09/30/2020 0826   BUN 21 09/30/2020 0826   BUN 12 09/18/2020 0000   CREATININE 2.02 (H) 09/30/2020 0826   CALCIUM 8.5 (  L) 09/30/2020 0826   PROT 4.6 (L) 09/30/2020 0826   ALBUMIN 1.7 (L) 09/30/2020 0826   AST 194 (H) 09/30/2020 0826   ALT 108 (H) 09/30/2020 0826   ALT 441 (A) 06/26/2020 0000   ALKPHOS 217 (H) 09/30/2020 0826   BILITOT 1.5 (H) 09/30/2020 0826   GFRNONAA 36 (L) 09/30/2020 0826    Imaging: CT HEAD WO CONTRAST  Result Date: 09/30/2020 CLINICAL DATA:  Unwitnessed fall, syncope, head and neck injury, history of hepatocellular carcinoma EXAM: CT HEAD WITHOUT CONTRAST CT CERVICAL SPINE WITHOUT CONTRAST TECHNIQUE: Multidetector CT imaging of the head and cervical spine was performed following the standard protocol without intravenous contrast. Multiplanar CT image reconstructions of the cervical spine were also generated. COMPARISON:  10/12/2019 head CT FINDINGS: CT HEAD FINDINGS Brain: Mild chronic white matter microvascular ischemic changes about the lateral ventricles. No acute intracranial hemorrhage, mass lesion, new infarction, midline shift, herniation, hydrocephalus, or extra-axial fluid collection. No focal mass effect or edema. Cisterns are patent. No gross cerebellar abnormality. Vascular: No hyperdense vessel or unexpected calcification. Skull: Normal. Negative for fracture or focal lesion. Sinuses/Orbits: Orbits unremarkable. Scattered mild sinus mucosal thickening. Other: None. CT CERVICAL SPINE FINDINGS Alignment: Normal. Skull base and vertebrae: No acute fracture. No primary bone lesion or focal pathologic process. Soft tissues and spinal canal: Normal prevertebral soft tissues. Multifactorial mild acquired spinal stenosis at C4-5, C5-6, and C6-7. Disc levels: Mild mid and lower cervical degenerative disc disease. Facets are aligned. No  subluxation or dislocation. Upper chest: Apical emphysema noted. Other: Right IJ port catheter present. Carotid and aortic atherosclerosis present. IMPRESSION: No acute intracranial abnormality by noncontrast CT. Stable minor white matter microvascular ischemic changes. Cervical degenerative changes as above. No acute cervical spine fracture or malalignment by CT. Aortic Atherosclerosis (ICD10-I70.0) and Emphysema (ICD10-J43.9). Electronically Signed   By: Jerilynn Mages.  Shick M.D.   On: 09/30/2020 09:41   CT CERVICAL SPINE WO CONTRAST  Result Date: 09/30/2020 CLINICAL DATA:  Unwitnessed fall, syncope, head and neck injury, history of hepatocellular carcinoma EXAM: CT HEAD WITHOUT CONTRAST CT CERVICAL SPINE WITHOUT CONTRAST TECHNIQUE: Multidetector CT imaging of the head and cervical spine was performed following the standard protocol without intravenous contrast. Multiplanar CT image reconstructions of the cervical spine were also generated. COMPARISON:  10/12/2019 head CT FINDINGS: CT HEAD FINDINGS Brain: Mild chronic white matter microvascular ischemic changes about the lateral ventricles. No acute intracranial hemorrhage, mass lesion, new infarction, midline shift, herniation, hydrocephalus, or extra-axial fluid collection. No focal mass effect or edema. Cisterns are patent. No gross cerebellar abnormality. Vascular: No hyperdense vessel or unexpected calcification. Skull: Normal. Negative for fracture or focal lesion. Sinuses/Orbits: Orbits unremarkable. Scattered mild sinus mucosal thickening. Other: None. CT CERVICAL SPINE FINDINGS Alignment: Normal. Skull base and vertebrae: No acute fracture. No primary bone lesion or focal pathologic process. Soft tissues and spinal canal: Normal prevertebral soft tissues. Multifactorial mild acquired spinal stenosis at C4-5, C5-6, and C6-7. Disc levels: Mild mid and lower cervical degenerative disc disease. Facets are aligned. No subluxation or dislocation. Upper chest: Apical  emphysema noted. Other: Right IJ port catheter present. Carotid and aortic atherosclerosis present. IMPRESSION: No acute intracranial abnormality by noncontrast CT. Stable minor white matter microvascular ischemic changes. Cervical degenerative changes as above. No acute cervical spine fracture or malalignment by CT. Aortic Atherosclerosis (ICD10-I70.0) and Emphysema (ICD10-J43.9). Electronically Signed   By: Jerilynn Mages.  Shick M.D.   On: 09/30/2020 09:41   DG Chest Port 1 View  Result Date: 09/30/2020 CLINICAL DATA:  Weakness EXAM: PORTABLE CHEST 1 VIEW COMPARISON:  07/20/2020 FINDINGS: Porta catheter with tip at the SVC. Streaky density at the bases which is improved from before and remaining likely reflecting scarring. There is no edema, consolidation, effusion, or pneumothorax. Extensive artifact from EKG leads. IMPRESSION: No acute finding. Mild scarring at the lung bases. Electronically Signed   By: Monte Fantasia M.D.   On: 09/30/2020 09:38    EKG: personally reviewed my interpretation is NSR  Assessment & Plan by Problem: Active Problems:   GI bleed   Sylvanus Telford is a 65 y.o. with pertinent PMHx of Stg IIIA (T3N0M0) multifocal hepatocellular carcinoma dx Feb 2021 currently treated with palliative chemotherapy 4x a month, Hx treated Hep C., T2DM, HTN, HLD, GERD who presented with weakness, hematemesis, bloody diarrhea, and syncope and admit for suspected GI bleed and further evaluation on hospital day 0  Syncope 2/2 Acute GI Bleed Hepatocellular Carcinoma Scott Farmer with acute hematemesis and bloody diarrhea the past few days with worsening weakness and syncopal episode PTA. Hgb of 61 today, last hgb 12 day sago 11.6. Lactate of 10.8, suspect secondary to hypovolemia. Etiology of syncope multifactorial including hypovolemia in setting of blood loss, decreased PO intake, and advanced cancer. Due to history of bright blood per rectum as well as dark stools and dark emesis, difficult to assess  whether upper or lower GI bleed. GI consulted, appreciate their recommendations. Due to progression of his advanced HCC, palliative was also consulted. Head CT negative for acute pathology post fall.  -2 units PRBC, repeat evening CBC 2 hours after 2nd unit. Night team to follow and give additional units as needed -2 lrg bore IV placed -continuous LR 125 cc/hr -IV protonix -EGD/Colonoscopy per GI tomorrow, NPO at midnight -Palliative consult pending  Acute on Chronic Kidney Injury Cr of 2.02 from 1.4. BUN normal of 21, but elevated from prior. Suspect pre-renal etiology in setting of severe hypovolemia -fluid resuscitating as per above.   Malnutrition 2/2 Advanced [place] Scott Farmer with temporal wasting, frail. Scott Farmer with decreased appetite recently. Albumin of 1.7. -nutrition consult placed  Abnormal Liver Enzymes Elevated Ammonia Consistent with Scott Farmer's [place]. Scott Farmer without AMS, will continue to monitor for encephalopathy in setting of acute decompensation and elevated ammonia.    Right Interior Lip Laceration Repaired in ED. 4 sutures placed. Will continue to monitor wound   Diet: Clear Liquid, NPO Midnight VTE: None IVF: NS,[place] Code: Full  Prior to Admission Living Arrangement: Home, living wife Anticipated Discharge Location: Home vs hospice  Barriers to Discharge: Further work up of GI bleed  Dispo: Admit Scott Farmer to Inpatient with expected length of stay greater than 2 midnights.  Signed: Riesa Pope, MD Internal Medicine Resident PGY-1 Pager: [phone]  09/30/2020, 5:48 PM

## 2020-09-30 NOTE — ED Notes (Signed)
Bair placed per verbal order of Dr Ashok Cordia.

## 2020-09-30 NOTE — Progress Notes (Signed)
MD received paged from RN after noticing a pulsatile abd mass. MD went to the bedside to evaluate. Patient reports he has had epigastric pain and chest discomfort since he fell today as well as chronic leg swelling, and chronic back pain but denies any dizziness, SOB, palpitations or leg pain.   On exam, patient has a pulsatile epigastric mass that is tender to palpation, hepatomegaly, mild ttp of the RUQ and RLQ, normal bowel sounds, no abd distension, palpable femoral pulses and trace BLE pitting edema.  Vitals: BP 121/63, HR 87, SpO2 97% and RR 13  CT abd/pel w/o contrast significant for pancolitis, multiple hepatic hypodense lesions with near complete replacement of the left lobe of the liver, moderate aortoiliac atherosclerotic disease, small ascites and anasarca.  A/P Patient with advance hepatocellular cancer ([place]) undergoing eval and management of GI bleed found to have a tender pulsatile abd mass. In the setting of patient's [facility] and imaging findings, his pulsatile mass is likely enlargement of his liver with the tenderness 2/2 to stretching of the liver capsule. The lack of pulse deficits in the extremities and no evidence of abdominal aortic aneurysm (AAA) on imaging makes AAA less likely. Patient remains hemodynamically stable with hgb improved to 8.4. Will continue to monitor closely.

## 2020-09-30 NOTE — Consult Note (Addendum)
[facility] Consult: 11:07 AM 09/30/2020  LOS: 0 days    Referring Provider: Dr. Ashok Cordia in the ED Primary Care Physician:  Cyndi Bender, PA-C Primary Gastroenterologist: None    Reason for Consultation: Dark emesis, bleeding per rectum.   HPI: Scott Farmer is a 65 y.o. male.  PMH hep C, treated.  Denies history cirrhosis.  Stage IV liver cancer under chemotherapy treatment by Dr. Hinton Rao at the cancer center in [place].  Emphysema.  Small epidural hematoma 07/2020 following a fall. Colonoscopy a few years ago when he was living in [place].  Does not recall any diagnosis of polyps or diverticulosis.  Stage IIIa (T3 N0 M0) multifocal hepatocellular carcinoma diagnosed 10/2019 after routine labs revealed abnormal LFTs and subsequent imaging.  Spent question of IVC thrombus on imaging. Initiated palliative treatment under the care of Dr. Hinton Rao.  Initially treated with palliative atezolizumab but this was stopped due to progressive disease, did not tolerate lenvatinib started in June.  Transitioned to ipilimumab, nivolumab He is on maintenance nivolumab, last treatment 1/13.  Being treated for pruritus with Zyrtec, Claritin, Benadryl, Vistaril.  Pain management with MS Contin and Dilaudid. Additional hematologic issue includes IDA treated with IV iron which she has not required since March.  Coagulopathy.  2 to 3 days of bloody bowel movements.  Some nonfocal abdominal pain that comes and goes and is relieved after vomiting.  Vomiting that was initially bilious but turned dark, coffee colored.  Vomiting this morning at 4 AM and had a syncopal spell with trauma and laceration to his lip.  Brought to the ED. Generally his appetite is poor but nausea and vomiting are not something he has had recently.  Has  never seen bloody stools.  Has not been using ibuprofen in a few weeks and if he does it is only in small doses.  Quit alcohol drinking several years ago.   Hgb 6.1, was 11.612 days ago and ranges 10-11 in recent months. WBCs 11.7.  Platelets 280.  Normal INR Elevated creatinine, AKI BUN just 21.  Reviewed LFTs and alk phos had been in the 400s, it is 217.  T bili is 1.5.  Transaminases 194/108, up from previous.  Venous lactic acid 10.8 FOBT positive CA 19-9 was 795 on 09/18/2020 No acute changes on CT head and cervical spine, CXR  Blood pressure 96 3/73, pulse 76 currently Temperature as low as 94 and warming blanket applied. Protonix drip initiated and patient received several boluses of LR.     Past Medical History:  Diagnosis Date  . Cancer ([place])    stage IV liver CA  . Diabetes mellitus without complication ([place])   . 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 GERD (gastroesophageal reflux disease)   . Glaucoma   . History of hepatitis C    treated  . Hyperlipidemia   . Hypertension     Past Surgical History:  Procedure Laterality Date  . ROTATOR CUFF REPAIR Right     Prior to Admission medications   Medication Sig Start Date End Date  Taking? Authorizing Provider  hydrOXYzine (VISTARIL) 50 MG capsule Take 1 capsule (50 mg total) by mouth 3 (three) times daily as needed. Patient taking differently: Take 50 mg by mouth 3 (three) times daily as needed for itching. 09/18/20  Yes Derwood Kaplan, MD  LORazepam (ATIVAN) 1 MG tablet TAKE 1 TABLET BY MOUTH EVERY 8 HOURS AS NEEDED FOR ANXIETY Patient not taking: No sig reported 09/15/20   Dayton Scrape A, NP  Oxycodone HCl 10 MG TABS Take 10 mg by mouth every 4 (four) hours as needed (pain).   Yes [provider]  cetirizine (ALLERGY, CETIRIZINE,) 10 MG tablet Take 1 tablet (10 mg total) by mouth daily. Patient not taking: No sig reported 09/06/20   Dayton Scrape A, NP  HYDROmorphone (DILAUDID) 2 MG tablet Take  1 tablet (2 mg total) by mouth every 4 (four) hours as needed for severe pain. Patient not taking: Reported on 09/30/2020 09/18/20   Melodye Ped, NP  nystatin-triamcinolone ointment [facility]) Apply 1 application topically 2 (two) times daily. Patient not taking: No sig reported 08/23/20   Dayton Scrape A, NP    Scheduled Meds: . lidocaine-EPINEPHrine  10 mL Infiltration Once  . Tdap  0.5 mL Intramuscular Once   Infusions: . sodium chloride    . pantoprozole (PROTONIX) infusion 8 mg/hr (09/30/20 1025)   PRN Meds:    Allergies as of 09/30/2020 - Review Complete 09/30/2020  Allergen Reaction Noted  . Hydromorphone Itching 06/18/2020  . Lortab [hydrocodone-acetaminophen] Itching 06/18/2020    Family History  Problem Relation Age of Onset  . Pancreatic cancer Brother        died at age 84    Social History   Socioeconomic History  . Marital status: Married    Spouse name: Not on file  . Number of children: Not on file  . Years of education: Not on file  . Highest education level: Not on file  Occupational History  . Not on file  Tobacco Use  . Smoking status: Current Every Day Smoker    Packs/day: 0.50    Years: 52.00    Pack years: 26.00    Start date: 07/11/1968    Last attempt to quit: 04/10/2020    Years since quitting: 0.4  . Smokeless tobacco: Never Used  Substance and Sexual Activity  . Alcohol use: Never  . Drug use: Never  . Sexual activity: Not Currently  Other Topics Concern  . Not on file  Social History Narrative  . Not on file   Social Determinants of Health   Financial Resource Strain: Not on file  Food Insecurity: Not on file  Transportation Needs: Not on file  Physical Activity: Not on file  Stress: Not on file  Social Connections: Not on file  Intimate Partner Violence: Not on file    REVIEW OF SYSTEMS: Constitutional: Weakness ENT:  No nose bleeds Pulm: No shortness of breath or cough CV:  No palpitations, no LE edema.  No  angina GU:  No hematuria, no frequency GI: See HPI. Heme: No unusual or excessive bleeding other than the bleeding per rectum in the last few days as well as bleeding from the laceration on his mouth today. Transfusions: None past Neuro:  No headaches, no peripheral tingling or numbness Derm:  No itching, no rash or sores.  Endocrine: Feels chills.  No polyuria or dysuria Immunization: Not queried Travel:  None beyond local counties in last few months.    PHYSICAL EXAM: Vital signs  in last 24 hours: Vitals:   09/30/20 0845 09/30/20 0942  BP: (!) 84/57 (!) 95/57  Pulse: 71 77  Resp: 10 20  Temp:  (!) 94 F (34.4 C)  SpO2: 100% 100%   Wt Readings from Last 3 Encounters:  09/30/20 79.2 kg  09/21/20 79.2 kg  09/18/20 81.4 kg    General: Pleasant, comfortable, looks moderately ill.  No distress. Head: No facial bruising.  There is a deep laceration in the lower right side of his lip that is bloody but not oozing blood any longer.  Tongue is midline. Eyes: No scleral icterus.  No conjunctival pallor. Ears: Hard of hearing Nose: No discharge Mouth: Lip laceration as above Neck: No JVD, no masses, no thyromegaly Lungs: Clear bilaterally.  No labored breathing.  Port-A-Cath positioned in the upper right chest Heart: RRR.  No MRG.  S1, S2 present Abdomen: Soft, not distended.  Active bowel sounds.  Minor tenderness over in the left abdomen..   Rectal: Bloody material on glove.  No masses.  No hemorrhoids Musc/Skeltl: No joint redness or swelling Extremities: Slight, nonpitting lower extremity/pedal edema Neurologic: Alert.  Appropriate.  Oriented x3.  Moves all 4 limbs.  No tremors. Skin: No rash or open sores Nodes: No cervical adenopathy Psych: Calm, cooperative  Intake/Output from previous day: No intake/output data recorded. Intake/Output this shift: No intake/output data recorded.  LAB RESULTS: Recent Labs    09/30/20 0826  WBC 11.7*  HGB 6.1*  HCT 20.1*  PLT 283    BMET Lab Results  Component Value Date   NA 142 09/30/2020   NA 138 09/18/2020   NA 138 08/21/2020   K 4.2 09/30/2020   K 4.0 09/18/2020   K 4.0 08/21/2020   CL 107 09/30/2020   CL 108 09/18/2020   CL 111 (A) 08/21/2020   CO2 16 (L) 09/30/2020   CO2 24 (A) 09/18/2020   CO2 20 08/21/2020   GLUCOSE 97 09/30/2020   BUN 21 09/30/2020   BUN 12 09/18/2020   BUN 16 08/21/2020   CREATININE 2.02 (H) 09/30/2020   CREATININE 1.4 (A) 09/18/2020   CREATININE 1.5 (A) 08/21/2020   CALCIUM 8.5 (L) 09/30/2020   CALCIUM 9.9 09/18/2020   CALCIUM 9.1 08/21/2020   LFT Recent Labs    09/30/20 0826  PROT 4.6*  ALBUMIN 1.7*  AST 194*  ALT 108*  ALKPHOS 217*  BILITOT 1.5*   PT/INR Lab Results  Component Value Date   INR 1.2 09/30/2020   Hepatitis Panel No results for input(s): HEPBSAG, HCVAB, HEPAIGM, HEPBIGM in the last 72 hours. C-Diff No components found for: CDIFF Lipase     Component Value Date/Time   LIPASE 12 09/30/2020 0826   COVID-negative   Drugs of Abuse  No results found for: LABOPIA, COCAINSCRNUR, LABBENZ, AMPHETMU, THCU, LABBARB   RADIOLOGY STUDIES: CT HEAD WO CONTRAST  Result Date: 09/30/2020 CLINICAL DATA:  Unwitnessed fall, syncope, head and neck injury, history of hepatocellular carcinoma EXAM: CT HEAD WITHOUT CONTRAST CT CERVICAL SPINE WITHOUT CONTRAST TECHNIQUE: Multidetector CT imaging of the head and cervical spine was performed following the standard protocol without intravenous contrast. Multiplanar CT image reconstructions of the cervical spine were also generated. COMPARISON:  10/12/2019 head CT FINDINGS: CT HEAD FINDINGS Brain: Mild chronic white matter microvascular ischemic changes about the lateral ventricles. No acute intracranial hemorrhage, mass lesion, new infarction, midline shift, herniation, hydrocephalus, or extra-axial fluid collection. No focal mass effect or edema. Cisterns are patent. No gross cerebellar abnormality. Vascular:  No  hyperdense vessel or unexpected calcification. Skull: Normal. Negative for fracture or focal lesion. Sinuses/Orbits: Orbits unremarkable. Scattered mild sinus mucosal thickening. Other: None. CT CERVICAL SPINE FINDINGS Alignment: Normal. Skull base and vertebrae: No acute fracture. No primary bone lesion or focal pathologic process. Soft tissues and spinal canal: Normal prevertebral soft tissues. Multifactorial mild acquired spinal stenosis at C4-5, C5-6, and C6-7. Disc levels: Mild mid and lower cervical degenerative disc disease. Facets are aligned. No subluxation or dislocation. Upper chest: Apical emphysema noted. Other: Right IJ port catheter present. Carotid and aortic atherosclerosis present. IMPRESSION: No acute intracranial abnormality by noncontrast CT. Stable minor white matter microvascular ischemic changes. Cervical degenerative changes as above. No acute cervical spine fracture or malalignment by CT. Aortic Atherosclerosis (ICD10-I70.0) and Emphysema (ICD10-J43.9). Electronically Signed   By: Jerilynn Mages.  Shick M.D.   On: 09/30/2020 09:41   CT CERVICAL SPINE WO CONTRAST  Result Date: 09/30/2020 CLINICAL DATA:  Unwitnessed fall, syncope, head and neck injury, history of hepatocellular carcinoma EXAM: CT HEAD WITHOUT CONTRAST CT CERVICAL SPINE WITHOUT CONTRAST TECHNIQUE: Multidetector CT imaging of the head and cervical spine was performed following the standard protocol without intravenous contrast. Multiplanar CT image reconstructions of the cervical spine were also generated. COMPARISON:  10/12/2019 head CT FINDINGS: CT HEAD FINDINGS Brain: Mild chronic white matter microvascular ischemic changes about the lateral ventricles. No acute intracranial hemorrhage, mass lesion, new infarction, midline shift, herniation, hydrocephalus, or extra-axial fluid collection. No focal mass effect or edema. Cisterns are patent. No gross cerebellar abnormality. Vascular: No hyperdense vessel or unexpected calcification.  Skull: Normal. Negative for fracture or focal lesion. Sinuses/Orbits: Orbits unremarkable. Scattered mild sinus mucosal thickening. Other: None. CT CERVICAL SPINE FINDINGS Alignment: Normal. Skull base and vertebrae: No acute fracture. No primary bone lesion or focal pathologic process. Soft tissues and spinal canal: Normal prevertebral soft tissues. Multifactorial mild acquired spinal stenosis at C4-5, C5-6, and C6-7. Disc levels: Mild mid and lower cervical degenerative disc disease. Facets are aligned. No subluxation or dislocation. Upper chest: Apical emphysema noted. Other: Right IJ port catheter present. Carotid and aortic atherosclerosis present. IMPRESSION: No acute intracranial abnormality by noncontrast CT. Stable minor white matter microvascular ischemic changes. Cervical degenerative changes as above. No acute cervical spine fracture or malalignment by CT. Aortic Atherosclerosis (ICD10-I70.0) and Emphysema (ICD10-J43.9). Electronically Signed   By: Jerilynn Mages.  Shick M.D.   On: 09/30/2020 09:41   DG Chest Port 1 View  Result Date: 09/30/2020 CLINICAL DATA:  Weakness EXAM: PORTABLE CHEST 1 VIEW COMPARISON:  07/20/2020 FINDINGS: Porta catheter with tip at the SVC. Streaky density at the bases which is improved from before and remaining likely reflecting scarring. There is no edema, consolidation, effusion, or pneumothorax. Extensive artifact from EKG leads. IMPRESSION: No acute finding. Mild scarring at the lung bases. Electronically Signed   By: Monte Fantasia M.D.   On: 09/30/2020 09:38      IMPRESSION:   *   Hematochezia, dark emesis.  *    Hepatocellular carcinoma under treatment with Dr. Hinton Rao in [place].  *     Blood loss anemia.  2 PRBCs ordered, infusions not yet started.    PLAN:     *    Per Dr. Loletha Carrow.  Patient likely will need colonoscopy and upper endoscopy. Allow sips of clear liquids as desired. Follow blood counts, additional PRBCs as indicated. Palliative care consult  has been ordered by the ED physician.   Azucena Freed  09/30/2020, 11:07 AM Phone [phone]  ____________________________________________________________  I have reviewed the entire case in detail with the above APP and discussed the plan in detail.  Therefore, I agree with the diagnoses recorded above. In addition,  I have personally interviewed and examined the patient.  My additional thoughts are as follows: (Additional physical exam finding is that the left lobe of his liver is enlarged tender and pulsatile)  Complex patient with advanced hepatocellular cancer now with GI bleeding of unclear source.  He has had progressive nausea and vomiting in recent weeks as well as reported dark emesis and now blood per rectum.  It is difficult to tell if this is upper GI bleeding, lower or both, but I am concerned that is primarily upper. It sounds like he has been bleeding for perhaps the last 2 weeks, and his hemoglobin is down over 5 g in less than 2 weeks.  Given his known advanced HCC, this could be hemobilia from tumor bleeding.  He could also have had capsular rupture and bleeding into the peritoneum, and he does have some lower abdominal tenderness.  He does not have an acute abdomen, and I think his elevated venous lactate is from hypotension and hypoperfusion.  He has acute on chronic kidney disease, precluding use of IV contrast for either standard CT scan or CT angio.  Blood pressure remains low with a systolic 81-275 and heart rate 90-100 as well during my evaluation.  He is just finishing his first unit of PRBCs for marked anemia with a hemoglobin of 6.  My plan is as follows: Continue Protonix drip Total 2 units PRBCs, then check posttransfusion H&H 2 hours after and continue to transfuse to keep hemoglobin over 7.  Every 6 hour hemoglobin and hematocrit thereafter.  Sips of water and ice chips until midnight, then n.p.o.  Noncontrast CT scan abdomen and pelvis, mainly to see if  there is hemoperitoneum.  Upper endoscopy and colonoscopy tomorrow morning.  Given his weakened condition and hypotension, I am not sure how much bowel prep you will be able to tolerate.  He says he is willing to give it is best to try.  We we will definitely do the EGD, even if we can only do a limited colonoscopy if insufficient prep.  These procedures were described in detail along with risks and benefits in the presence of his ED nurse and he was agreeable.  The benefits and risks of the planned procedure were described in detail with the patient or (when appropriate) their health care proxy.  Risks were outlined as including, but not limited to, bleeding, infection, perforation, adverse medication reaction leading to cardiac or pulmonary decompensation, pancreatitis (if ERCP).  The limitation of incomplete mucosal visualization was also discussed.  No guarantees or warranties were given.  Patient at increased risk for cardiopulmonary complications of procedure due to medical comorbidities.   Prognosis poor given known progressive advanced HCC and now significant GI bleeding.  (Complex medical decision making and increased risk procedure)  Nelida Meuse III [phone]

## 2020-09-30 NOTE — Consult Note (Signed)
Consultation Note Date: 09/30/2020   Patient Name: Scott Farmer  DOB: [birth year]  MRN: [id number]  Age / Sex: 65 y.o., male  PCP: Cyndi Bender, PA-C Referring Physician: Lajean Saver, MD  Reason for Consultation: Establishing goals of care  HPI/Patient Profile: 65 y.o. male  with past medical history of Hepatitis C (treated), emphysema, and hepatocellular carcinoma (diagnosed February 2021) under palliative treatment. He was brought to the emergency room on 09/30/2020 with nausea/vomiting, syncopal episode, and fall from toilet. Also reporting 2-3 days of bloody bowel movements.  ED Course: Hypothermic with temperature as low as 94, warming blanket applied. Blood pressure low, treated with several fluid boluses. Protonix infusion initiated. Hemoglobin 6.1, lactic acid 10.8, albumin 1.7. CT head negative for acute intracranial abnormality. GI consulted, with recommendation for upper endoscopy and colonoscopy in the morning. Also recommending transfusion with 2 units PRBCs, and noncontrast CT abdomen to check for hemoperitoneum.    Clinical Assessment and Goals of Care: I have reviewed medical records including EPIC notes, labs and imaging, examined the patient and discussed with ED physician Dr.Steinl. I met at bedside with patient  to discuss diagnosis, prognosis, GOC, EOL wishes, disposition, and options.  I introduced Palliative Medicine as specialized medical care for people living with serious illness. It focuses on providing relief from the symptoms and stress of a serious illness.   We discussed a brief life review of the patient. He is originally from [place], [place]. He and his wife Katharine Look have been married for 16 years. They moved to [place] about 4 years ago. Katharine Look has 3 children from her previous marriage (a son and daughter in [place] and another son in [place]). They also have 1  daughter together who lives in [place].   I asked patient what he understood from his oncologist regarding his prognosis. He is unable to specifically verbalize this. He does share that his wife is not ready for "him to leave her".  14:10--I spoke with wife Katharine Look by phone. I introduced Palliative medicine. I asked Katharine Look what she understood regarding his prognosis. Initially, she expresses frustration with oncology that "more hasn't been done to help him", and specifically wonders why he didn't have surgery. Education was provided regarding his staging at diagnosis, and that only 10-15% of [facility] patients have curative options (including surgery) at diagnosis. Education provided that his treatment was palliative from the beginning, with the goal of prolonging life. Discussed that the dominant right hepatic lesion has continued to grow despite treatment. Also discussed that per oncology notes, he was having significant diarrhea and weight loss from treatment.   We discussed his current illness and what it means in the larger context of his ongoing co-morbidities.  Natural disease trajectory of advanced cancer was discussed. Expressed concern that his prognosis is poor in the setting of advanced cancer now with GI bleeding. Katharine Look is tearful but verbalizes understanding. She expresses that she doesn't want him to suffer.   I introduced the concept of a comfort path to Katharine Look and encouraged her  to think about at what point they would want to stop aggressive medical interventions and focus on quality of life rather than cure/prolonging life. Introduced hospice philosophy and provided information on home vs residential hospice services - answered all questions. Sandra's goal would be to get him home with hospice.   We did discus code status. Emphasizing that in the event of cardiopulmonary arrest, aggressive resuscitation efforts often are unsuccessful and were likely to cause additional pain/trauma.  Expressed a recommendation for having a DNR order in place, to which Katharine Look agrees.    17:15--I visited patient again at bedside and made him aware of my discussion with Katharine Look. He seems slightly more confused than earlier. However, he does agree that he would want to go home with hospice once he is stable.  He also agrees with code status of DNR--emphasizing that in the event of cardiopulmonary arrest, we would allow him to pass naturally and peacefully rather than implementing interventions that were likely to cause pain/trauma.   Primary decision maker: Patient, with support from wife Katharine Look    SUMMARY OF RECOMMENDATIONS   - Full scope treatment for now - Code status changed to DNR/DNI - Tentative goal is home with hospice, once patient is medically stabilized - Plan to meet at bedside tomorrow at noon with patient, wife, and son   Code Status/Advance Care Planning:  DNR  Symptom Management:   Per primary team  Palliative Prophylaxis:   Frequent Pain Assessment and Turn Reposition  Additional Recommendations (Limitations, Scope, Preferences):  Full Scope Treatment (in order to medically stabilize)  Psycho-social/Spiritual:   Created space and opportunity for patient and family to express thoughts and feelings regarding patient's current medical situation.   Emotional support provided   Prognosis:   poor  Discharge Planning: To Be Determined      Primary Diagnoses: Present on Admission: **None**   I have reviewed the medical record, interviewed the patient and family, and examined the patient. The following aspects are pertinent.  Past Medical History:  Diagnosis Date  . Cancer ([place])    stage IV liver CA  . Diabetes mellitus without complication ([place])   . 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 GERD (gastroesophageal reflux disease)   . Glaucoma   . History of hepatitis C    treated  . Hyperlipidemia   . Hypertension     Family History  Problem  Relation Age of Onset  . Pancreatic cancer Brother        died at age 39   Scheduled Meds: Continuous Infusions: . sodium chloride    . pantoprozole (PROTONIX) infusion 8 mg/hr (09/30/20 1110)   PRN Meds:. Medications Prior to Admission:  Prior to Admission medications   Medication Sig Start Date End Date Taking? Authorizing Provider  hydrOXYzine (VISTARIL) 50 MG capsule Take 1 capsule (50 mg total) by mouth 3 (three) times daily as needed. Patient taking differently: Take 50 mg by mouth 3 (three) times daily as needed for itching. 09/18/20  Yes Derwood Kaplan, MD  LORazepam (ATIVAN) 1 MG tablet TAKE 1 TABLET BY MOUTH EVERY 8 HOURS AS NEEDED FOR ANXIETY Patient not taking: No sig reported 09/15/20   Dayton Scrape A, NP  Oxycodone HCl 10 MG TABS Take 10 mg by mouth every 4 (four) hours as needed (pain).   Yes [provider]  cetirizine (ALLERGY, CETIRIZINE,) 10 MG tablet Take 1 tablet (10 mg total) by mouth daily. Patient not taking: No sig reported 09/06/20   Ronne Binning,  Melissa A, NP  HYDROmorphone (DILAUDID) 2 MG tablet Take 1 tablet (2 mg total) by mouth every 4 (four) hours as needed for severe pain. Patient not taking: Reported on 09/30/2020 09/18/20   Melodye Ped, NP  nystatin-triamcinolone ointment [facility]) Apply 1 application topically 2 (two) times daily. Patient not taking: No sig reported 08/23/20   Dayton Scrape A, NP   Allergies  Allergen Reactions  . Hydromorphone Itching  . Lortab [Hydrocodone-Acetaminophen] Itching   Review of Systems  Gastrointestinal: Positive for blood in stool and vomiting.  Neurological: Positive for weakness.    Physical Exam Vitals reviewed.  Constitutional:      General: He is not in acute distress.    Appearance: He is ill-appearing.     Comments: Temporal wasting  Cardiovascular:     Rate and Rhythm: Normal rate and regular rhythm.  Pulmonary:     Effort: Pulmonary effort is normal.  Neurological:      Mental Status: He is alert and oriented to person, place, and time.     Vital Signs: BP (!) 98/56   Pulse 77   Temp (!) 96 F (35.6 C) (Rectal)   Resp (!) 22   Ht $R'5\' 10"'Sg$  (1.778 m)   Wt 79.2 kg   SpO2 100%   BMI 25.04 kg/m      Pain Score: 4    SpO2: SpO2: 100 % O2 Device:SpO2: 100 % O2 Flow Rate: .   IO: Intake/output summary:   Intake/Output Summary (Last 24 hours) at 09/30/2020 1153 Last data filed at 09/30/2020 1110 Gross per 24 hour  Intake 199.83 ml  Output -  Net 199.83 ml    LBM:   Baseline Weight: Weight: 79.2 kg Most recent weight: Weight: 79.2 kg      Palliative Assessment/Data: PPS 30%     Time In: 11:30 Time Out: 12:45 Time Total: 75 minutes Greater than 50%  of this time was spent counseling and coordinating care related to the above assessment and plan.  Signed by: Lavena Bullion, NP   Please contact Palliative Medicine Team phone at [phone] for questions and concerns.  For individual provider: See Shea Evans

## 2020-09-30 NOTE — ED Notes (Signed)
PT speaking with wife.  Mentation continues to improve.  Will give meds for nausea.

## 2020-09-30 NOTE — ED Notes (Signed)
Bair hugger removed.  Pt mentation improved.

## 2020-10-01 ENCOUNTER — Encounter (HOSPITAL_COMMUNITY): Payer: Self-pay | Admitting: Internal Medicine

## 2020-10-01 ENCOUNTER — Inpatient Hospital Stay (HOSPITAL_COMMUNITY): Payer: Medicare Other | Admitting: Anesthesiology

## 2020-10-01 ENCOUNTER — Encounter (HOSPITAL_COMMUNITY)
Admission: EM | Disposition: A | Discharge status: Hospice | Payer: Self-pay | Source: Home / Self Care | Attending: Internal Medicine

## 2020-10-01 DIAGNOSIS — N179 Acute kidney failure, unspecified: Secondary | ICD-10-CM

## 2020-10-01 HISTORY — PX: ESOPHAGOGASTRODUODENOSCOPY (EGD) WITH PROPOFOL: SHX5813

## 2020-10-01 HISTORY — PX: COLONOSCOPY: SHX5424

## 2020-10-01 LAB — BASIC METABOLIC PANEL
Anion gap: 12 (ref 5–15)
BUN: 26 mg/dL — ABNORMAL HIGH (ref 8–23)
CO2: 22 mmol/L (ref 22–32)
Calcium: 8.1 mg/dL — ABNORMAL LOW (ref 8.9–10.3)
Chloride: 108 mmol/L (ref 98–111)
Creatinine, Ser: 1.97 mg/dL — ABNORMAL HIGH (ref 0.61–1.24)
GFR, Estimated: 37 mL/min — ABNORMAL LOW (ref >60)
Glucose, Bld: 130 mg/dL — ABNORMAL HIGH (ref 70–99)
Potassium: 4.2 mmol/L (ref 3.5–5.1)
Sodium: 142 mmol/L (ref 135–145)

## 2020-10-01 LAB — CBC
HCT: 22.1 % — ABNORMAL LOW (ref 39.0–52.0)
HCT: 19.5 % — ABNORMAL LOW (ref 39.0–52.0)
HCT: 21.3 % — ABNORMAL LOW (ref 39.0–52.0)
HCT: 24.1 % — ABNORMAL LOW (ref 39.0–52.0)
Hemoglobin: 7.4 g/dL — ABNORMAL LOW (ref 13.0–17.0)
Hemoglobin: 6.9 g/dL — CL (ref 13.0–17.0)
Hemoglobin: 7.1 g/dL — ABNORMAL LOW (ref 13.0–17.0)
Hemoglobin: 8.1 g/dL — ABNORMAL LOW (ref 13.0–17.0)
MCH: 31.6 pg (ref 26.0–34.0)
MCH: 32.5 pg (ref 26.0–34.0)
MCH: 30.7 pg (ref 26.0–34.0)
MCH: 31 pg (ref 26.0–34.0)
MCHC: 33.5 g/dL (ref 30.0–36.0)
MCHC: 35.4 g/dL (ref 30.0–36.0)
MCHC: 33.3 g/dL (ref 30.0–36.0)
MCHC: 33.6 g/dL (ref 30.0–36.0)
MCV: 94.4 fL (ref 80.0–100.0)
MCV: 92 fL (ref 80.0–100.0)
MCV: 92.2 fL (ref 80.0–100.0)
MCV: 92.3 fL (ref 80.0–100.0)
Platelets: 208 10*3/uL (ref 150–400)
Platelets: 177 10*3/uL (ref 150–400)
Platelets: 177 10*3/uL (ref 150–400)
Platelets: 169 10*3/uL (ref 150–400)
RBC: 2.34 MIL/uL — ABNORMAL LOW (ref 4.22–5.81)
RBC: 2.12 MIL/uL — ABNORMAL LOW (ref 4.22–5.81)
RBC: 2.31 MIL/uL — ABNORMAL LOW (ref 4.22–5.81)
RBC: 2.61 MIL/uL — ABNORMAL LOW (ref 4.22–5.81)
RDW: 17.2 % — ABNORMAL HIGH (ref 11.5–15.5)
RDW: 17.1 % — ABNORMAL HIGH (ref 11.5–15.5)
RDW: 16.9 % — ABNORMAL HIGH (ref 11.5–15.5)
RDW: 17.1 % — ABNORMAL HIGH (ref 11.5–15.5)
WBC: 12.4 10*3/uL — ABNORMAL HIGH (ref 4.0–10.5)
WBC: 11.9 10*3/uL — ABNORMAL HIGH (ref 4.0–10.5)
WBC: 13.1 10*3/uL — ABNORMAL HIGH (ref 4.0–10.5)
WBC: 13.5 10*3/uL — ABNORMAL HIGH (ref 4.0–10.5)
nRBC: 0.5 % — ABNORMAL HIGH (ref 0.0–0.2)
nRBC: 1.1 % — ABNORMAL HIGH (ref 0.0–0.2)
nRBC: 1.4 % — ABNORMAL HIGH (ref 0.0–0.2)
nRBC: 1 % — ABNORMAL HIGH (ref 0.0–0.2)

## 2020-10-01 LAB — LACTIC ACID, PLASMA
Lactic Acid, Venous: 2.5 mmol/L (ref 0.5–1.9)
Lactic Acid, Venous: 1.4 mmol/L (ref 0.5–1.9)

## 2020-10-01 LAB — HEMOGLOBIN AND HEMATOCRIT, BLOOD
HCT: 12.6 % — ABNORMAL LOW (ref 39.0–52.0)
Hemoglobin: 4 g/dL — CL (ref 13.0–17.0)

## 2020-10-01 LAB — PREPARE RBC (CROSSMATCH)

## 2020-10-01 LAB — GLUCOSE, CAPILLARY: Glucose-Capillary: 104 mg/dL — ABNORMAL HIGH (ref 70–99)

## 2020-10-01 SURGERY — ESOPHAGOGASTRODUODENOSCOPY (EGD) WITH PROPOFOL
Anesthesia: Monitor Anesthesia Care

## 2020-10-01 MED ORDER — PEG-KCL-NACL-NASULF-NA ASC-C 100 G PO SOLR: 0.5000 | Freq: Once | ORAL | Status: DC

## 2020-10-01 MED ORDER — SODIUM CHLORIDE 0.9% IV SOLUTION: Freq: Once | INTRAVENOUS | Status: DC

## 2020-10-01 MED ORDER — PHENYLEPHRINE HCL (PRESSORS) 10 MG/ML IV SOLN
INTRAVENOUS | Status: DC | PRN
  Administered 2020-10-01: 150 ug via INTRAVENOUS

## 2020-10-01 MED ORDER — LACTATED RINGERS IV SOLN: INTRAVENOUS | Status: DC | PRN

## 2020-10-01 MED ORDER — PROPOFOL 500 MG/50ML IV EMUL
INTRAVENOUS | Status: DC | PRN
  Administered 2020-10-01: 100 ug/kg/min via INTRAVENOUS

## 2020-10-01 MED ORDER — BUTAMBEN-TETRACAINE-BENZOCAINE 2-2-14 % EX AERO
INHALATION_SPRAY | CUTANEOUS | Status: DC | PRN
  Administered 2020-10-01: 1 via TOPICAL

## 2020-10-01 MED ORDER — LACTATED RINGERS IV SOLN
INTRAVENOUS | Status: AC | PRN
  Administered 2020-10-01: 20 mL/h via INTRAVENOUS

## 2020-10-01 MED ORDER — PEG-KCL-NACL-NASULF-NA ASC-C 100 G PO SOLR
0.5000 | Freq: Once | ORAL | Status: AC
  Administered 2020-10-01: 100 g via ORAL
  Filled 2020-10-01: qty 1

## 2020-10-01 MED ORDER — SODIUM CHLORIDE 0.9 % IV SOLN: INTRAVENOUS | Status: DC

## 2020-10-01 MED ORDER — PEG-KCL-NACL-NASULF-NA ASC-C 100 G PO SOLR
0.5000 | Freq: Once | ORAL | Status: AC
  Administered 2020-10-01: 100 g via ORAL

## 2020-10-01 SURGICAL SUPPLY — 24 items
BLOCK BITE 60FR ADLT L/F BLUE (MISCELLANEOUS) ×2 IMPLANT
ELECT REM PT RETURN 9FT ADLT (ELECTROSURGICAL)
ELECTRODE REM PT RTRN 9FT ADLT (ELECTROSURGICAL) IMPLANT
FCP BXJMBJMB 240X2.8X (CUTTING FORCEPS)
FLOOR PAD 36X40 (MISCELLANEOUS) ×2
FORCEP RJ3 GP 1.8X160 W-NEEDLE (CUTTING FORCEPS) IMPLANT
FORCEPS BIOP RAD 4 LRG CAP 4 (CUTTING FORCEPS) IMPLANT
FORCEPS BIOP RJ4 240 W/NDL (CUTTING FORCEPS)
FORCEPS BXJMBJMB 240X2.8X (CUTTING FORCEPS) IMPLANT
INJECTOR/SNARE I SNARE (MISCELLANEOUS) IMPLANT
LUBRICANT JELLY 4.5OZ STERILE (MISCELLANEOUS) IMPLANT
MANIFOLD NEPTUNE II (INSTRUMENTS) IMPLANT
NEEDLE SCLEROTHERAPY 25GX240 (NEEDLE) IMPLANT
PAD FLOOR 36X40 (MISCELLANEOUS) ×1 IMPLANT
PROBE APC STR FIRE (PROBE) IMPLANT
PROBE INJECTION GOLD (MISCELLANEOUS)
PROBE INJECTION GOLD 7FR (MISCELLANEOUS) IMPLANT
SNARE ROTATE MED OVAL 20MM (MISCELLANEOUS) IMPLANT
SNARE SHORT THROW 13M SML OVAL (MISCELLANEOUS) IMPLANT
SYR 50ML LL SCALE MARK (SYRINGE) IMPLANT
TRAP SPECIMEN MUCOUS 40CC (MISCELLANEOUS) IMPLANT
TUBING ENDO SMARTCAP PENTAX (MISCELLANEOUS) ×4 IMPLANT
TUBING IRRIGATION ENDOGATOR (MISCELLANEOUS) ×4 IMPLANT
WATER STERILE IRR 1000ML POUR (IV SOLUTION) IMPLANT

## 2020-10-01 NOTE — Anesthesia Procedure Notes (Signed)
Procedure Name: MAC Date/Time: 10/01/2020 9:00 AM Performed by: Eligha Bridegroom, CRNA Pre-anesthesia Checklist: Patient identified, Emergency Drugs available, Suction available, Patient being monitored and Timeout performed Patient Re-evaluated:Patient Re-evaluated prior to induction Oxygen Delivery Method: Nasal cannula Preoxygenation: Pre-oxygenation with 100% oxygen Induction Type: IV induction

## 2020-10-01 NOTE — Op Note (Signed)
[facility] Patient Name: Scott Farmer Procedure Date : 10/01/2020 MRN: [id number] Attending MD: Estill Cotta. Loletha Carrow , MD Date of Birth: 02-18-1956 CSN: [id number] Age: 65 Admit Type: Inpatient Procedure:                Upper GI endoscopy Indications:              Acute post hemorrhagic anemia, Hematochezia                            (advanced HCC, clinical concern for UGIB or perhaps                            hemobilia) Providers:                Estill Cotta. Loletha Carrow, MD, Grace Isaac, RN, Benetta Spar, Technician, Eligha Bridegroom CRNA, CRNA Referring MD:             [facility] Medical Teaching Service Medicines:                Monitored Anesthesia Care Complications:            No immediate complications. Estimated Blood Loss:     Estimated blood loss: none. Procedure:                Pre-Anesthesia Assessment:                           - Prior to the procedure, a History and Physical                            was performed, and patient medications and                            allergies were reviewed. The patient's tolerance of                            previous anesthesia was also reviewed. The risks                            and benefits of the procedure and the sedation                            options and risks were discussed with the patient.                            All questions were answered, and informed consent                            was obtained. Prior Anticoagulants: The patient has                            taken no previous anticoagulant or antiplatelet  agents except for aspirin. ASA Grade Assessment: IV                            - A patient with severe systemic disease that is a                            constant threat to life. After reviewing the risks                            and benefits, the patient was deemed in                            satisfactory condition to undergo the procedure.                            After obtaining informed consent, the endoscope was                            passed under direct vision. Throughout the                            procedure, the patient's blood pressure, pulse, and                            oxygen saturations were monitored continuously. The                            [id number]-[id number] ([id number]) Olympus gastroscope was                            introduced through the mouth, and advanced to the                            second part of duodenum. The patient tolerated the                            procedure well. The upper GI endoscopy was                            accomplished without difficulty. Scope In: Scope Out: Findings:      The esophagus was normal.      The stomach was normal.      The cardia and gastric fundus were normal on retroflexion.      The examined duodenum was normal. Impression:               - Normal esophagus.                           - Normal stomach.                           - Normal examined duodenum.                           - No specimens  collected. Moderate Sedation:      MAC sedation used Recommendation:           - See the other procedure note for documentation of                            additional recommendations.                           - Discontinue pantoprazole drip                           Clear liquid diet Procedure Code(s):        --- Professional ---                           [phone], Esophagogastroduodenoscopy, flexible,                            transoral; diagnostic, including collection of                            specimen(s) by brushing or washing, when performed                            (separate procedure) Diagnosis Code(s):        --- Professional ---                           D62, Acute posthemorrhagic anemia                           K92.1, Melena (includes Hematochezia) CPT copyright 2019 American Medical Association. All rights reserved. The codes documented in this report  are preliminary and upon coder review may  be revised to meet current compliance requirements. Aurel Nguyen L. Loletha Carrow, MD 10/01/2020 10:11:13 AM This report has been signed electronically. Number of Addenda: 0

## 2020-10-01 NOTE — Progress Notes (Signed)
On assessment ABD is pulsating,able to hear and fell pulsation.  On-call MD came to the bedside to assess.     Donah Driver, RN

## 2020-10-01 NOTE — Progress Notes (Signed)
Spoke to resident physician Dr. Johnney Ou re: bleeding and anemia  (Hgb 4).  They are repeating and transfusing.   Advised Q 6 hr H/H after that and sign out patient to overnight resident for checks on patient.   If brisk rebleeding with BRBPR/maroon blood, obtain stat tagged RBC scan. (creatinine will not allow CTA)  I will be sure our overnight MD is aware of patient as well.   Madelon Lips, MD

## 2020-10-01 NOTE — Progress Notes (Signed)
CRITICAL VALUE ALERT  Critical Value:  Hgb 4.0  Date & Time Notied:  10/01/2020, 11:45pm  Provider Notified: Jaci Standard, MD  Orders Received/Actions taken: lab redrawn

## 2020-10-01 NOTE — Transfer of Care (Signed)
Immediate Anesthesia Transfer of Care Note  Patient: Scott Farmer  Procedure(s) Performed: ESOPHAGOGASTRODUODENOSCOPY (EGD) WITH PROPOFOL (N/A ) COLONOSCOPY (N/A )  Patient Location: PACU  Anesthesia Type:MAC  Level of Consciousness: awake, alert  and oriented  Airway & Oxygen Therapy: Patient Spontanous Breathing  Post-op Assessment: Report given to RN  Post vital signs: Reviewed and stable  Last Vitals:  Vitals Value Taken Time  BP 109/79   Temp 36.4   Pulse 76   Resp 14   SpO2 97     Last Pain:  Vitals:   10/01/20 0827  TempSrc: Oral  PainSc: 8       Patients Stated Pain Goal: 3 (01/65/80 0634)  Complications: No complications documented.

## 2020-10-01 NOTE — H&P (View-Only) (Signed)
Spoke to resident physician Dr. Katsadouros re: bleeding and anemia  (Hgb 4).  They are repeating and transfusing.   Advised Q 6 hr H/H after that and sign out patient to overnight resident for checks on patient.   If brisk rebleeding with BRBPR/maroon blood, obtain stat tagged RBC scan. (creatinine will not allow CTA)  I will be sure our overnight MD is aware of patient as well.   H. Danis, MD 

## 2020-10-01 NOTE — Op Note (Signed)
[facility] Patient Name: Scott Farmer Procedure Date : 10/01/2020 MRN: [id number] Attending MD: Estill Cotta. Loletha Carrow , MD Date of Birth: [birth year] CSN: [id number] Age: 65 Admit Type: Inpatient Procedure:                Colonoscopy Indications:              Hematochezia, Acute post hemorrhagic anemia Providers:                Mallie Mussel L. Loletha Carrow, MD, Benetta Spar, Technician,                            Eligha Bridegroom CRNA Referring MD:             [facility] Teaching Service Medicines:                Monitored Anesthesia Care Complications:            No immediate complications. Estimated Blood Loss:     Estimated blood loss: none. Procedure:                Pre-Anesthesia Assessment:                           - Prior to the procedure, a History and Physical                            was performed, and patient medications and                            allergies were reviewed. The patient's tolerance of                            previous anesthesia was also reviewed. The risks                            and benefits of the procedure and the sedation                            options and risks were discussed with the patient.                            All questions were answered, and informed consent                            was obtained. Prior Anticoagulants: The patient has                            taken no previous anticoagulant or antiplatelet                            agents except for aspirin. ASA Grade Assessment: IV                            - A patient with severe systemic disease that is a  constant threat to life. After reviewing the risks                            and benefits, the patient was deemed in                            satisfactory condition to undergo the procedure.                           After obtaining informed consent, the colonoscope                            was passed under direct vision. Throughout the                             procedure, the patient's blood pressure, pulse, and                            oxygen saturations were monitored continuously. The                            [id number]-HQ190L ([id number]) Olympus colonoscope was                            introduced through the anus and advanced to the the                            terminal ileum, with identification of the                            appendiceal orifice and IC valve. The colonoscopy                            was performed with difficulty due to excessive                            bleeding. The patient tolerated the procedure well.                            The quality of the bowel preparation was poor. The                            terminal ileum, ileocecal valve, appendiceal                            orifice, and rectum were photographed. The bowel                            preparation used was MoviPrep (patient only                            consumed a small amount). Scope In: 9:21:51 AM Scope Out: 9:52:43 AM Scope Withdrawal Time: 0 hours 18 minutes 6 seconds  Total Procedure Duration: 0  hours 30 minutes 52 seconds  Findings:      The perianal and digital rectal examinations were normal except for       active passage of dark blood and clots.      The terminal ileum appeared normal except for scant flecks of backwashed       old blood.      Dark red blood, clots and some stool were found in the entire colon.       Extensive lavage performed which certainly could not clear the colon,       but no active bleeding was seen.      The exam was otherwise without abnormality on direct and retroflexion       views. Impression:               - Preparation of the colon was poor.                           - The examined portion of the ileum was normal.                           - Blood in the entire examined colon.                           - The examination was otherwise normal on direct                            and  retroflexion views. Significantly limited                            visualization despite extensive lavage.                           - No specimens collected.                           Lower GI bleeding. The bleeding does not appear to                            be coming from the small bowel. No active bleeding                            at the time of this exam, given limitations noted                            above. Moderate Sedation:      MAC sedation used Recommendation:           - Return patient to hospital ward for ongoing care.                           - See the other procedure note for documentation of                            additional recommendations.                           -  Clear liquid diet.                           - Perform a colonoscopy tomorrow after better bowel                            preparation.                           (Wife Scott Farmer updated by phone) Procedure Code(s):        --- Professional ---                           [phone], Colonoscopy, flexible; diagnostic, including                            collection of specimen(s) by brushing or washing,                            when performed (separate procedure) Diagnosis Code(s):        --- Professional ---                           K92.2, Gastrointestinal hemorrhage, unspecified                           K92.1, Melena (includes Hematochezia)                           D62, Acute posthemorrhagic anemia CPT copyright 2019 American Medical Association. All rights reserved. The codes documented in this report are preliminary and upon coder review may  be revised to meet current compliance requirements. Saryna Kneeland L. Loletha Carrow, MD 10/01/2020 10:22:25 AM This report has been signed electronically. Number of Addenda: 0

## 2020-10-01 NOTE — Progress Notes (Signed)
Daily Progress Note   Patient Name: Scott Farmer       Date: 10/01/2020 DOB: 1956-07-26  Age: 65 y.o. MRN#: [id number] Attending Physician: Angelica Pou, MD Primary Care Physician: Cyndi Bender, PA-C Admit Date: 09/30/2020  Reason for Follow-up: continued GOC discussion  Subjective: I met with patient, wife Scott Farmer, and son Scott Farmer at bedside.  Discussion was had regarding his current medical condition in the context of his cancer diagnosis. Education provided regarding his staging at diagnosis, and that only 10-15% of [place] patients have curative options at diagnosis. Education provided that his treatment was palliative from the beginning, with the goal of prolonging life. Discussed that the dominant right hepatic lesion has continued to grow despite treatment. Also discussed that per oncology notes, he was having diarrhea and progressive weight loss fthroughout treatment. Scott Farmer confirms the significant burden of these symptoms.   Provided counseling that his time is limited. Patient and wife are tearful but receptive. Scott Farmer tells him she doesn't want him to suffer any more. Discussed need to shift focus of care to promoting comfort and quality of life for the time he has left. Discussed that hospice can help support him and Scott Farmer in the home setting, helping him to feel as good as he can for as long as possible.   Patient and wife are both agreeable to home with hospice. They think they would prefer [facility], since they have a hospice house in [facility] that would be close if he needed to transition there at some point.   Discussed need to medically stabilize him as much as possible prior to getting him home. This includes repeat colonscopy, and PRBCs transfusion to  treat his low hemoglobin.  Length of Stay: 1  Current Medications: Scheduled Meds:  . sodium chloride   Intravenous Once  . Chlorhexidine Gluconate Cloth  6 each Topical Daily  . nicotine  14 mg Transdermal Daily  . peg 3350 powder  0.5 kit Oral Once  . peg 3350 powder  0.5 kit Oral Once    Continuous Infusions: . sodium chloride    . sodium chloride    . lactated ringers Stopped (10/01/20 0263)  . pantoprozole (PROTONIX) infusion 8 mg/hr (10/01/20 0637)    PRN Meds: HYDROmorphone HCl, sodium chloride flush  Physical Exam Vitals reviewed.  Constitutional:      General: He is not in acute distress.    Appearance: He is ill-appearing.     Comments: Frail with temporal wasting  Pulmonary:     Effort: Pulmonary effort is normal.  Neurological:     Mental Status: He is alert and oriented to person, place, and time.     Motor: Weakness present.             Vital Signs: BP 116/61 (BP Location: Right Arm)   Pulse 85   Temp (!) 97.5 F (36.4 C) (Oral)   Resp 14   Ht $R'5\' 10"'ti$  (1.778 m)   Wt 79.2 kg   SpO2 92%   BMI 25.04 kg/m  SpO2: SpO2: 92 % O2 Device: O2 Device: Room Air O2 Flow Rate:    Intake/output summary:   Intake/Output Summary (Last 24 hours) at 10/01/2020 1324 Last data filed at 10/01/2020 3300 Gross per 24 hour  Intake 2790 ml  Output 600 ml  Net 2190 ml   LBM: Last BM Date: 10/01/20 Baseline Weight: Weight: 79.2 kg Most recent weight: Weight: 79.2 kg       Palliative Assessment/Data: PPS 30%     Palliative Care Assessment & Plan   HPI/Patient Profile: 65 y.o. male  with past medical history of Hepatitis C (treated), emphysema, and hepatocellular carcinoma (diagnosed February 2021) under palliative treatment through [place] in [place]. He was brought to the emergency room on 09/30/2020 with nausea/vomiting, syncopal episode, and fall from toilet. Also reporting 2-3 days of bloody bowel movements.  ED Course: Hypothermic with temperature as low as 94,  warming blanket applied. Blood pressure low, treated with several fluid boluses. Protonix infusion initiated. Hemoglobin 6.1, lactic acid 10.8, albumin 1.7. CT head negative for acute intracranial abnormality. GI consulted. Patient admitted to IM teaching service for management of acute GI bleed in the setting of underlying advanced hepatocellular carcinoma.  Assessment: - hepatocellular carcinoma - acute GI bleed, lower? - weakness, syncope - acute on chronic kidney injury - malnutrition secondary to advanced cancer  Recommendations/Plan:  DNR/DNI as previously documented  Plan confirmed for home with hospice - preference is [facility] order placed and hospice liaison notified  Continue current medical care, with goal to medically stabilize as much as possible prior to discharge  Equipment needs for home: [facility], bedside table, and bed rails  PMT will continue to follow  Code Status: DNR/DNI  Prognosis:  Poor, weeks to months  Discharge Planning:  Home with hospice when medically stable  Care plan was discussed with Dr. Dorian Pod, Dr. Johnney Ou, primary RN, hospice liaison  Thank you for allowing the Palliative Medicine Team to assist in the care of this patient.   Total Time 35 minutes Prolonged Time Billed no      Greater than 50%  of this time was spent counseling and coordinating care related to the above assessment and plan.  Lavena Bullion, NP  Please contact Palliative Medicine Team phone at [phone] for questions and concerns.

## 2020-10-01 NOTE — Anesthesia Preprocedure Evaluation (Signed)
Anesthesia Evaluation  Patient identified by MRN, date of birth, ID band Patient awake    Airway Mallampati: II  TM Distance: >3 FB Neck ROM: Full    Dental  (+) Dental Advisory Given   Pulmonary Current Smoker,    breath sounds clear to auscultation       Cardiovascular hypertension, Pt. on medications  Rhythm:Regular Rate:Normal     Neuro/Psych negative neurological ROS     GI/Hepatic GERD  ,(+) Cirrhosis       , Hepatitis -, C  Endo/Other  diabetes  Renal/GU negative Renal ROS     Musculoskeletal   Abdominal   Peds  Hematology  (+) anemia ,   Anesthesia Other Findings   Reproductive/Obstetrics                             Anesthesia Physical Anesthesia Plan  ASA: IV  Anesthesia Plan: MAC   Post-op Pain Management:    Induction:   PONV Risk Score and Plan: 0 and Propofol infusion, Ondansetron and Treatment may vary due to age or medical condition  Airway Management Planned: Natural Airway and Nasal Cannula  Additional Equipment:   Intra-op Plan:   Post-operative Plan:   Informed Consent: I have reviewed the patients History and Physical, chart, labs and discussed the procedure including the risks, benefits and alternatives for the proposed anesthesia with the patient or authorized representative who has indicated his/her understanding and acceptance.       Plan Discussed with: CRNA  Anesthesia Plan Comments:         Anesthesia Quick Evaluation

## 2020-10-01 NOTE — Progress Notes (Signed)
HD#1 Subjective:  Overnight Events: Night Resident notified patient after she noticed palpable abdominal mass.    Patient evaluated at bedside, he is resting in bed comfortably. He notes overall feeling better than yesterday, but notes feeling cold. Informed him this is a common side effect with GI bleeding. Informed him his hemoglobin level came back low at 4.0 and that we would be repeating this but preparing to give him blood. He endorsed being updated by Dr. Loletha Carrow on the plan to repeat colonoscopy tomorrow after further bowel prep.   He also notes his wife arriving soon and that he has a scheduled meeting with palliative care to further discuss goals of care.   Objective:  Vital signs in last 24 hours: Vitals:   09/30/20 1820 09/30/20 1921 09/30/20 2237 09/30/20 2238  BP:  109/63 110/62 (!) 107/58  Pulse:  91    Resp:  19 14 (!) 22  Temp: 98.2 F (36.8 C) 97.9 F (36.6 C) 98.8 F (37.1 C)   TempSrc: Oral Oral Oral   SpO2:  100%    Weight:      Height:       Supplemental O2: Room Air SpO2: 100 %   Physical Exam:  Constitutional: Ill/toxic appearing HENT: temporal wasting Eyes: conjunctiva jaundiced Neck: supple Cardiovascular: regular rate and rhythm, thready radial pulses Pulmonary/Chest: normal work of breathing on room air Abdominal: soft, non-tender, non-distended MSK: thin, pale palms, poor capillary refill Neurological: alert & oriented x 3, Able to count backwards by 5's. Skin: cool, jaundiced Psych: Normal mood  Filed Weights   09/30/20 0756  Weight: 79.2 kg     Intake/Output Summary (Last 24 hours) at 10/01/2020 0644 Last data filed at 09/30/2020 2200 Gross per 24 hour  Intake 5868.2 ml  Output 500 ml  Net 5368.2 ml   Net IO Since Admission: 5,368.2 mL [10/01/20 0644]  Pertinent Labs: CBC Latest Ref Rng & Units 10/01/2020 09/30/2020 09/30/2020  WBC 4.0 - 10.5 K/uL 12.4(H) 18.0(H) 11.7(H)  Hemoglobin 13.0 - 17.0 g/dL 7.4(L) 8.4(L) 6.1(LL)   Hematocrit 39.0 - 52.0 % 22.1(L) 25.3(L) 20.1(L)  Platelets 150 - 400 K/uL 208 218 283    CMP Latest Ref Rng & Units 10/01/2020 09/30/2020 09/18/2020  Glucose 70 - 99 mg/dL 130(H) 97 -  BUN 8 - 23 mg/dL 26(H) 21 12  Creatinine 0.61 - 1.24 mg/dL 1.97(H) 2.02(H) 1.4(A)  Sodium 135 - 145 mmol/L 142 142 138  Potassium 3.5 - 5.1 mmol/L 4.2 4.2 4.0  Chloride 98 - 111 mmol/L 108 107 108  CO2 22 - 32 mmol/L 22 16(L) 24(A)  Calcium 8.9 - 10.3 mg/dL 8.1(L) 8.5(L) 9.9  Total Protein 6.5 - 8.1 g/dL - 4.6(L) -  Total Bilirubin 0.3 - 1.2 mg/dL - 1.5(H) -  Alkaline Phos 38 - 126 U/L - 217(H) 465(A)  AST 15 - 41 U/L - 194(H) 92(A)  ALT 0 - 44 U/L - 108(H) 117(A)    Imaging: CT ABDOMEN PELVIS WO CONTRAST  Result Date: 09/30/2020 CLINICAL DATA:  65 year old male with hepatobiliary carcinoma and GI bleed. EXAM: CT ABDOMEN AND PELVIS WITHOUT CONTRAST TECHNIQUE: Multidetector CT imaging of the abdomen and pelvis was performed following the standard protocol without IV contrast. COMPARISON:  CT abdomen pelvis dated 05/30/2020. FINDINGS: Evaluation of this exam is limited in the absence of intravenous contrast. Evaluation is also limited due to anasarca and loss of abdominal fat. Lower chest: Trace right pleural effusions. There are bibasilar linear atelectasis/scarring. Irregular nodular density along  the right posterior pleural surface may be related to atelectasis. Pleural metastatic disease is not excluded. There is coronary vascular calcification. There is hypoattenuation of the cardiac blood pool suggestive of anemia. Clinical correlation is recommended. No intra-abdominal free air. Small ascites. Hepatobiliary: Multiple hepatic hypodense lesions with near complete replacement of the left lobe of the liver in keeping with known malignancy. No gallstone. Pancreas: The pancreas is poorly visualized. There is mild atrophic appearance of the body and tail of the pancreas similar to prior CT. Spleen: Normal in size  without focal abnormality. Adrenals/Urinary Tract: The adrenal glands unremarkable. There is no hydronephrosis or nephrolithiasis on either side. The urinary bladder is partially distended and grossly unremarkable. Stomach/Bowel: Evaluation of the bowel is limited in the absence of oral contrast and due to limiting factors mentioned above. There is diffuse thickened appearance of the colon most concerning for pancolitis. Correlation with clinical exam and stool cultures recommended. No evidence of bowel obstruction. The appendix is poorly visualized. Vascular/Lymphatic: Moderate aortoiliac atherosclerotic disease. The IVC is grossly unremarkable. No portal venous gas. No adenopathy. Reproductive: The prostate and seminal vesicles are grossly unremarkable. Small right inguinal hernia. Other: Diffuse subcutaneous edema and anasarca. Musculoskeletal: No acute or significant osseous findings. IMPRESSION: 1. Pancolitis. Correlation with clinical exam and stool cultures recommended. No bowel obstruction. 2. Multiple hepatic hypodense lesions with near complete replacement of the left lobe of the liver in keeping with known malignancy. 3. Small ascites, diffuse subcutaneous edema, and anasarca. 4. Aortic Atherosclerosis (ICD10-I70.0). Electronically Signed   By: Anner Crete M.D.   On: 09/30/2020 19:07   CT HEAD WO CONTRAST  Result Date: 09/30/2020 CLINICAL DATA:  Unwitnessed fall, syncope, head and neck injury, history of hepatocellular carcinoma EXAM: CT HEAD WITHOUT CONTRAST CT CERVICAL SPINE WITHOUT CONTRAST TECHNIQUE: Multidetector CT imaging of the head and cervical spine was performed following the standard protocol without intravenous contrast. Multiplanar CT image reconstructions of the cervical spine were also generated. COMPARISON:  10/12/2019 head CT FINDINGS: CT HEAD FINDINGS Brain: Mild chronic white matter microvascular ischemic changes about the lateral ventricles. No acute intracranial hemorrhage,  mass lesion, new infarction, midline shift, herniation, hydrocephalus, or extra-axial fluid collection. No focal mass effect or edema. Cisterns are patent. No gross cerebellar abnormality. Vascular: No hyperdense vessel or unexpected calcification. Skull: Normal. Negative for fracture or focal lesion. Sinuses/Orbits: Orbits unremarkable. Scattered mild sinus mucosal thickening. Other: None. CT CERVICAL SPINE FINDINGS Alignment: Normal. Skull base and vertebrae: No acute fracture. No primary bone lesion or focal pathologic process. Soft tissues and spinal canal: Normal prevertebral soft tissues. Multifactorial mild acquired spinal stenosis at C4-5, C5-6, and C6-7. Disc levels: Mild mid and lower cervical degenerative disc disease. Facets are aligned. No subluxation or dislocation. Upper chest: Apical emphysema noted. Other: Right IJ port catheter present. Carotid and aortic atherosclerosis present. IMPRESSION: No acute intracranial abnormality by noncontrast CT. Stable minor white matter microvascular ischemic changes. Cervical degenerative changes as above. No acute cervical spine fracture or malalignment by CT. Aortic Atherosclerosis (ICD10-I70.0) and Emphysema (ICD10-J43.9). Electronically Signed   By: Jerilynn Mages.  Shick M.D.   On: 09/30/2020 09:41   CT CERVICAL SPINE WO CONTRAST  Result Date: 09/30/2020 CLINICAL DATA:  Unwitnessed fall, syncope, head and neck injury, history of hepatocellular carcinoma EXAM: CT HEAD WITHOUT CONTRAST CT CERVICAL SPINE WITHOUT CONTRAST TECHNIQUE: Multidetector CT imaging of the head and cervical spine was performed following the standard protocol without intravenous contrast. Multiplanar CT image reconstructions of the cervical spine were also generated.  COMPARISON:  10/12/2019 head CT FINDINGS: CT HEAD FINDINGS Brain: Mild chronic white matter microvascular ischemic changes about the lateral ventricles. No acute intracranial hemorrhage, mass lesion, new infarction, midline shift,  herniation, hydrocephalus, or extra-axial fluid collection. No focal mass effect or edema. Cisterns are patent. No gross cerebellar abnormality. Vascular: No hyperdense vessel or unexpected calcification. Skull: Normal. Negative for fracture or focal lesion. Sinuses/Orbits: Orbits unremarkable. Scattered mild sinus mucosal thickening. Other: None. CT CERVICAL SPINE FINDINGS Alignment: Normal. Skull base and vertebrae: No acute fracture. No primary bone lesion or focal pathologic process. Soft tissues and spinal canal: Normal prevertebral soft tissues. Multifactorial mild acquired spinal stenosis at C4-5, C5-6, and C6-7. Disc levels: Mild mid and lower cervical degenerative disc disease. Facets are aligned. No subluxation or dislocation. Upper chest: Apical emphysema noted. Other: Right IJ port catheter present. Carotid and aortic atherosclerosis present. IMPRESSION: No acute intracranial abnormality by noncontrast CT. Stable minor white matter microvascular ischemic changes. Cervical degenerative changes as above. No acute cervical spine fracture or malalignment by CT. Aortic Atherosclerosis (ICD10-I70.0) and Emphysema (ICD10-J43.9). Electronically Signed   By: Jerilynn Mages.  Shick M.D.   On: 09/30/2020 09:41   DG Chest Port 1 View  Result Date: 09/30/2020 CLINICAL DATA:  Weakness EXAM: PORTABLE CHEST 1 VIEW COMPARISON:  07/20/2020 FINDINGS: Porta catheter with tip at the SVC. Streaky density at the bases which is improved from before and remaining likely reflecting scarring. There is no edema, consolidation, effusion, or pneumothorax. Extensive artifact from EKG leads. IMPRESSION: No acute finding. Mild scarring at the lung bases. Electronically Signed   By: Monte Fantasia M.D.   On: 09/30/2020 09:38    Assessment/Plan:   Active Problems:   GI bleed   Patient Summary: Aceson Kuhlmann is a 65 y.o. with pertinent PMHx of Stg IIIA (T3N0M0) multifocal hepatocellular carcinoma dx Feb 2021 currently treated with  palliative chemotherapy 4x a month, Hx treated Hep C., T2DM, HTN, HLD, GERD who presented with weakness, hematemesis, bloody diarrhea, and syncope and admit for suspected GI bleed and further evaluation on hospital day 0  Syncope 2/2 Acute GI Bleed Hepatocellular Carcinoma Physical exam consistent with anemia and advanced hepatocellular carcinoma. Patient taken for EGD today by Dr. Loletha Carrow, no active bleed found, however, colonoscopy with limited assessment due to inadequate bowel prep. Patient unable to consume full dose of bowel prep yesterday. Will continue on clear liquids and have patient scheduled for colonoscopy tomorrow. NPO at midnight. Patient also found to have hgb of 4.0, repeat of 6.7. Believe faulty lab draw, 1 unit PRBC ordered. Plan is if patient has brisk blood per rectum, to order tagged RBC scan. Appreciate Dr. Loletha Carrow and teams time and recommendations.   Patient has had multiple goals of care discussions with palliative. Extensive goals of cares discussions were had and patient has agreed to transition from full code to DNR. Discussions still ongoing whether or not patient would like to go to hospice vs home after hospitalization. Appreciate NP Mcilquham's recommendations -CBC after 1 unit PRBC. CBC q6h after, night team will be following closely. Plan as per above -continuous LR 125 cc/hr -IV protonix -repeat colonoscopy tomorrow midnight -Palliative consult following.  Acute on Chronic Kidney Injury Cr of 2.02 from 1.4. BUN normal of 21, but elevated from prior. Suspect pre-renal etiology in setting of severe hypovolemia -fluid resuscitating as per above.   Malnutrition 2/2 Advanced [place] Patient with temporal wasting, frail. Patient with decreased appetite recently. Albumin of 1.7. -nutrition consult placed  Diet: Clear  Liquid, NPO Midnight VTE: None IVF: NS,B[place]us Code: Full  Diet: Clear Liquids IVF: LR,125cc/hr VTE: None Code: DNR  Dispo: Anticipated discharge to  [place] pending further workup and further goals of care discussions  [facility] Resident PGY-1 Pager [phone] Please contact the on call pager after 5 pm and on weekends at [phone].

## 2020-10-01 NOTE — Anesthesia Postprocedure Evaluation (Signed)
Anesthesia Post Note  Patient: Scott Farmer  Procedure(s) Performed: ESOPHAGOGASTRODUODENOSCOPY (EGD) WITH PROPOFOL (N/A ) COLONOSCOPY (N/A )     Patient location during evaluation: PACU Anesthesia Type: MAC Level of consciousness: awake and alert Pain management: pain level controlled Vital Signs Assessment: post-procedure vital signs reviewed and stable Respiratory status: spontaneous breathing, nonlabored ventilation, respiratory function stable and patient connected to nasal cannula oxygen Cardiovascular status: stable and blood pressure returned to baseline Postop Assessment: no apparent nausea or vomiting Anesthetic complications: no   No complications documented.  Last Vitals:  Vitals:   10/01/20 1600 10/01/20 1615  BP: 124/64 124/64  Pulse: 78 78  Resp: 20 20  Temp: 36.4 C 36.4 C  SpO2:  92%    Last Pain:  Vitals:   10/01/20 1615  TempSrc: Oral  PainSc:                  Tiajuana Amass

## 2020-10-01 NOTE — Interval H&P Note (Signed)
History and Physical Interval Note:  10/01/2020 8:55 AM  Scott Farmer  has presented today for surgery, with the diagnosis of gi bleed    anemia.     hepatocellular cancer.  The various methods of treatment have been discussed with the patient and family. After consideration of risks, benefits and other options for treatment, the patient has consented to  Procedure(s): ESOPHAGOGASTRODUODENOSCOPY (EGD) WITH PROPOFOL (N/A) COLONOSCOPY WITH PROPOFOL (N/A) as a surgical intervention.  The patient's history has been reviewed, patient examined, no change in status, stable for surgery.  I have reviewed the patient's chart and labs.  Questions were answered to the patient's satisfaction.    Patient took little prep and still has dark bloody output in fecal collection system.   Will do EGD and then try colon to see if any visualization possible with lavage.  If not, and no bleeding explanation on EGD, more prep today for colonoscopy tomorrow. Hgb 7.4 this AM.  Patient alert.  BP and pulse normal   Nelida Meuse III

## 2020-10-02 ENCOUNTER — Inpatient Hospital Stay (HOSPITAL_COMMUNITY): Payer: Medicare Other | Admitting: Anesthesiology

## 2020-10-02 ENCOUNTER — Telehealth: Payer: Self-pay | Admitting: Internal Medicine

## 2020-10-02 ENCOUNTER — Encounter (HOSPITAL_COMMUNITY): Payer: Self-pay | Admitting: Internal Medicine

## 2020-10-02 ENCOUNTER — Encounter (HOSPITAL_COMMUNITY)
Admission: EM | Disposition: A | Discharge status: Hospice | Payer: Self-pay | Source: Home / Self Care | Attending: Internal Medicine

## 2020-10-02 ENCOUNTER — Ambulatory Visit: Payer: Medicare Other | Admitting: Oncology

## 2020-10-02 DIAGNOSIS — Z789 Other specified health status: Secondary | ICD-10-CM

## 2020-10-02 DIAGNOSIS — K5520 Angiodysplasia of colon without hemorrhage: Secondary | ICD-10-CM

## 2020-10-02 DIAGNOSIS — R52 Pain, unspecified: Secondary | ICD-10-CM

## 2020-10-02 DIAGNOSIS — K921 Melena: Secondary | ICD-10-CM

## 2020-10-02 HISTORY — PX: HOT HEMOSTASIS: SHX5433

## 2020-10-02 HISTORY — PX: COLONOSCOPY WITH PROPOFOL: SHX5780

## 2020-10-02 LAB — CBC
HCT: 22.2 % — ABNORMAL LOW (ref 39.0–52.0)
HCT: 21.9 % — ABNORMAL LOW (ref 39.0–52.0)
HCT: 22.1 % — ABNORMAL LOW (ref 39.0–52.0)
Hemoglobin: 7.4 g/dL — ABNORMAL LOW (ref 13.0–17.0)
Hemoglobin: 7.6 g/dL — ABNORMAL LOW (ref 13.0–17.0)
Hemoglobin: 7.3 g/dL — ABNORMAL LOW (ref 13.0–17.0)
MCH: 30.8 pg (ref 26.0–34.0)
MCH: 31.5 pg (ref 26.0–34.0)
MCH: 30.4 pg (ref 26.0–34.0)
MCHC: 33.3 g/dL (ref 30.0–36.0)
MCHC: 34.7 g/dL (ref 30.0–36.0)
MCHC: 33 g/dL (ref 30.0–36.0)
MCV: 92.5 fL (ref 80.0–100.0)
MCV: 90.9 fL (ref 80.0–100.0)
MCV: 92.1 fL (ref 80.0–100.0)
Platelets: 164 10*3/uL (ref 150–400)
Platelets: 166 10*3/uL (ref 150–400)
Platelets: 160 10*3/uL (ref 150–400)
RBC: 2.4 MIL/uL — ABNORMAL LOW (ref 4.22–5.81)
RBC: 2.41 MIL/uL — ABNORMAL LOW (ref 4.22–5.81)
RBC: 2.4 MIL/uL — ABNORMAL LOW (ref 4.22–5.81)
RDW: 17.4 % — ABNORMAL HIGH (ref 11.5–15.5)
RDW: 17.4 % — ABNORMAL HIGH (ref 11.5–15.5)
RDW: 17.5 % — ABNORMAL HIGH (ref 11.5–15.5)
WBC: 13 10*3/uL — ABNORMAL HIGH (ref 4.0–10.5)
WBC: 13.1 10*3/uL — ABNORMAL HIGH (ref 4.0–10.5)
WBC: 13 10*3/uL — ABNORMAL HIGH (ref 4.0–10.5)
nRBC: 0.8 % — ABNORMAL HIGH (ref 0.0–0.2)
nRBC: 0.9 % — ABNORMAL HIGH (ref 0.0–0.2)
nRBC: 0.8 % — ABNORMAL HIGH (ref 0.0–0.2)

## 2020-10-02 LAB — BASIC METABOLIC PANEL
Anion gap: 8 (ref 5–15)
BUN: 24 mg/dL — ABNORMAL HIGH (ref 8–23)
CO2: 23 mmol/L (ref 22–32)
Calcium: 7.5 mg/dL — ABNORMAL LOW (ref 8.9–10.3)
Chloride: 109 mmol/L (ref 98–111)
Creatinine, Ser: 1.75 mg/dL — ABNORMAL HIGH (ref 0.61–1.24)
GFR, Estimated: 42 mL/min — ABNORMAL LOW (ref >60)
Glucose, Bld: 113 mg/dL — ABNORMAL HIGH (ref 70–99)
Potassium: 2.9 mmol/L — ABNORMAL LOW (ref 3.5–5.1)
Sodium: 140 mmol/L (ref 135–145)

## 2020-10-02 LAB — GLUCOSE, CAPILLARY: Glucose-Capillary: 87 mg/dL (ref 70–99)

## 2020-10-02 SURGERY — COLONOSCOPY WITH PROPOFOL
Anesthesia: Monitor Anesthesia Care

## 2020-10-02 MED ORDER — ENSURE ENLIVE PO LIQD
237.0000 mL | Freq: Two times a day (BID) | ORAL | Status: DC
  Administered 2020-10-03: 237 mL via ORAL

## 2020-10-02 MED ORDER — ADULT MULTIVITAMIN W/MINERALS CH
1.0000 | ORAL_TABLET | Freq: Every day | ORAL | Status: DC
  Administered 2020-10-02 – 2020-10-03 (×2): 1 via ORAL
  Filled 2020-10-02 (×2): qty 1

## 2020-10-02 MED ORDER — HYDROMORPHONE HCL 1 MG/ML IJ SOLN
1.0000 mg | Freq: Once | INTRAMUSCULAR | Status: AC
  Administered 2020-10-02: 1 mg via INTRAVENOUS
  Filled 2020-10-02: qty 1

## 2020-10-02 MED ORDER — LIP MEDEX EX OINT
TOPICAL_OINTMENT | CUTANEOUS | Status: DC | PRN
  Filled 2020-10-02: qty 7

## 2020-10-02 MED ORDER — PROPOFOL 500 MG/50ML IV EMUL
INTRAVENOUS | Status: DC | PRN
  Administered 2020-10-02: 200 ug/kg/min via INTRAVENOUS

## 2020-10-02 MED ORDER — PHENYLEPHRINE 40 MCG/ML (10ML) SYRINGE FOR IV PUSH (FOR BLOOD PRESSURE SUPPORT)
PREFILLED_SYRINGE | INTRAVENOUS | Status: DC | PRN
  Administered 2020-10-02 (×2): 80 ug via INTRAVENOUS

## 2020-10-02 MED ORDER — POTASSIUM CHLORIDE CRYS ER 20 MEQ PO TBCR: 40.0000 meq | EXTENDED_RELEASE_TABLET | Freq: Two times a day (BID) | ORAL | Status: DC

## 2020-10-02 MED ORDER — POTASSIUM CHLORIDE CRYS ER 20 MEQ PO TBCR
40.0000 meq | EXTENDED_RELEASE_TABLET | Freq: Two times a day (BID) | ORAL | Status: DC
  Administered 2020-10-02 – 2020-10-03 (×3): 40 meq via ORAL
  Filled 2020-10-02 (×3): qty 2

## 2020-10-02 SURGICAL SUPPLY — 22 items

## 2020-10-02 NOTE — Telephone Encounter (Signed)
Pt's wife called asking to speak with Dr. Henrene Pastor. She stated that he performed a colonoscopy for his husband this morning and has not yet been able to speak with him. She stated that she was giving this phone number to speak with Dr. Henrene Pastor.

## 2020-10-02 NOTE — Progress Notes (Signed)
Initial Nutrition Assessment  DOCUMENTATION CODES:   Not applicable  INTERVENTION:   -Ensure Enlive po BID, each supplement provides 350 kcal and 20 grams of protein -Magic cup TID with meals, each supplement provides 290 kcal and 9 grams of protein -MVI with minerals daily  NUTRITION DIAGNOSIS:   Increased nutrient needs related to cancer and cancer related treatments as evidenced by estimated needs.  GOAL:   Patient will meet greater than or equal to 90% of their needs  MONITOR:   Supplement acceptance,PO intake,Labs,Weight trends,Skin,I & O's  REASON FOR ASSESSMENT:   Consult Assessment of nutrition requirement/status  ASSESSMENT:   Scott Farmer is a 65 y.o. with pertinent PMHx of Stg IIIA (T3N0M0) multifocal hepatocellular carcinoma dx Feb 2021 currently treated with palliative chemotherapy 4x a month, Hx treated Hep C., T2DM, HTN, HLD, GERD who presented with weakness, hematemesis, bloody diarrhea, and syncope and admit for suspected GI bleed and further evaluation  Pt admitted with syncope secondary to GIB and hepatocellular carcinoma.   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   No meal completion data currently available at this time, however, was previously NPO or on clear liquid diet. Diet was just advanced to regular.  Reviewed wt hx; pt has experienced a 4.4% wt loss over the past month. While this is not significant for time frame, it is concerning given terminal cancer.   Palliative care has been following pt; plan to discharge home with hospice services.   Pt with increased  nutritional needs and would benefit from addition of oral nutrition supplements.   Medications reviewed and include lactated ringers infusion.  Labs reviewed: K: 2.9 (on PO supplementation).   Diet Order:   Diet Order            Diet regular Room service appropriate? Yes; Fluid consistency: Thin  Diet effective now                 EDUCATION NEEDS:   No education needs have been identified at this time  Skin:  Skin Assessment: Reviewed RN Assessment  Last BM:  10/03/19 (via rectal pouch)  Height:   Ht Readings from Last 1 Encounters:  09/30/20 5\' 10"  (1.778 m)    Weight:   Wt Readings from Last 1 Encounters:  09/30/20 79.2 kg    Ideal Body Weight:  75.5 kg  BMI:  Body mass index is 25.04 kg/m.  Estimated Nutritional Needs:   Kcal:  2200-2400  Protein:  105-120 grams  Fluid:  > 2 L    Loistine Chance, RD, LDN, [place] Registered Dietitian II Certified Diabetes Care and Education Specialist Please refer to [facility] for RD and/or RD on-call/weekend/after hours pager

## 2020-10-02 NOTE — Anesthesia Preprocedure Evaluation (Addendum)
Anesthesia Evaluation  Patient identified by MRN, date of birth, ID band Patient awake    Reviewed: Allergy & Precautions, NPO status , Patient's Chart, lab work & pertinent test results  Airway Mallampati: III  TM Distance: >3 FB Neck ROM: Full    Dental  (+) Edentulous Upper   Pulmonary Current Smoker and Patient abstained from smoking.,    Pulmonary exam normal breath sounds clear to auscultation       Cardiovascular hypertension, Normal cardiovascular exam Rhythm:Regular Rate:Normal     Neuro/Psych PSYCHIATRIC DISORDERS Anxiety negative neurological ROS     GI/Hepatic GERD  ,(+) Cirrhosis       , Hepatitis -, CH/o stage IIIa multifocal hepatocellular carcinoma diagnosed November 01, 2019 receiving palliative chemotherapy   Endo/Other  negative endocrine ROSdiabetes  Renal/GU negative Renal ROSCr 1.75, K 2.9 (currently being repleted)  negative genitourinary   Musculoskeletal negative musculoskeletal ROS (+)   Abdominal   Peds  Hematology  (+) Blood dyscrasia (Hgb 7.6), anemia ,   Anesthesia Other Findings Colonoscopy for hematochezia and acute blood loss anemia    Reproductive/Obstetrics                           Anesthesia Physical Anesthesia Plan  ASA: III  Anesthesia Plan: MAC   Post-op Pain Management:    Induction: Intravenous  PONV Risk Score and Plan: Propofol infusion and Treatment may vary due to age or medical condition  Airway Management Planned: Natural Airway  Additional Equipment:   Intra-op Plan:   Post-operative Plan:   Informed Consent: I have reviewed the patients History and Physical, chart, labs and discussed the procedure including the risks, benefits and alternatives for the proposed anesthesia with the patient or authorized representative who has indicated his/her understanding and acceptance.   Patient has DNR.  Discussed DNR with patient and  Suspend DNR.   Dental advisory given  Plan Discussed with: CRNA  Anesthesia Plan Comments:         Anesthesia Quick Evaluation

## 2020-10-02 NOTE — Progress Notes (Signed)
HD#2 Subjective:  Overnight Events:    Mr. Kissler was seen this AM at bedside. He states that he is doing well today and tolerated his procedure well. Denies bloody bowel movements since. He is hungry and thirsty and is just awaiting his diet. We spoke about his discussions with palliative and ultimate placement, and he prefers to go to a facility. No other concerns or questions.    Objective:  Vital signs in last 24 hours: Vitals:   10/01/20 1615 10/01/20 1958 10/02/20 0224 10/02/20 0227  BP: 124/64 (!) 108/59 111/65 105/62  Pulse: 78 87    Resp: 20 17 15 16   Temp: 97.6 F (36.4 C) 98.6 F (37 C) 98.3 F (36.8 C)   TempSrc: Oral Oral Oral   SpO2: 92% 95% (!) 89% 90%  Weight:      Height:       Supplemental O2: Room Air SpO2: 90 %   Physical Exam:  Constitutional: Ill/toxic appearing HENT: temporal wasting Eyes: conjunctiva jaundiced Neck: supple Cardiovascular: regular rate and rhythm Pulmonary/Chest: normal work of breathing on room air MSK: thin,  Neurological: alert & oriented x 3 Skin: cool, jaundiced Psych: Normal mood  Filed Weights   09/30/20 0756  Weight: 79.2 kg     Intake/Output Summary (Last 24 hours) at 10/02/2020 0641 Last data filed at 10/02/2020 0229 Gross per 24 hour  Intake 3501.56 ml  Output 1100 ml  Net 2401.56 ml   Net IO Since Admission: 7,769.76 mL [10/02/20 0641]  Pertinent Labs: CBC Latest Ref Rng & Units 10/02/2020 10/01/2020 10/01/2020  WBC 4.0 - 10.5 K/uL 13.0(H) 13.5(H) 13.1(H)  Hemoglobin 13.0 - 17.0 g/dL 7.4(L) 8.1(L) 7.1(L)  Hematocrit 39.0 - 52.0 % 22.2(L) 24.1(L) 21.3(L)  Platelets 150 - 400 K/uL 164 169 177    CMP Latest Ref Rng & Units 10/01/2020 09/30/2020 09/18/2020  Glucose 70 - 99 mg/dL 130(H) 97 -  BUN 8 - 23 mg/dL 26(H) 21 12  Creatinine 0.61 - 1.24 mg/dL 1.97(H) 2.02(H) 1.4(A)  Sodium 135 - 145 mmol/L 142 142 138  Potassium 3.5 - 5.1 mmol/L 4.2 4.2 4.0  Chloride 98 - 111 mmol/L 108 107 108  CO2 22 - 32  mmol/L 22 16(L) 24(A)  Calcium 8.9 - 10.3 mg/dL 8.1(L) 8.5(L) 9.9  Total Protein 6.5 - 8.1 g/dL - 4.6(L) -  Total Bilirubin 0.3 - 1.2 mg/dL - 1.5(H) -  Alkaline Phos 38 - 126 U/L - 217(H) 465(A)  AST 15 - 41 U/L - 194(H) 92(A)  ALT 0 - 44 U/L - 108(H) 117(A)    Imaging: No results found.  Assessment/Plan:   Active Problems:   GI bleed   Patient Summary: Scott Farmer is a 65 y.o. with pertinent PMHx of Stg IIIA (T3N0M0) multifocal hepatocellular carcinoma dx Feb 2021 currently treated with palliative chemotherapy 4x a month, Hx treated Hep C., T2DM, HTN, HLD, GERD who presented with weakness, hematemesis, bloody diarrhea, and syncope and admit for suspected GI bleed and further evaluation  Syncope 2/2 Acute GI Bleed Hepatocellular Carcinoma Patient taken back for repeat colonoscopy today after completing bowel prep. Single non-bleeding colonic angiodysplastic lesion. Treated with argon beam coagulation. Patient denies any bright red stools today. GI signing off. Patient has had further discussion with palliative concerning home care vs hospice. Changed to DNR during admission -restart diet per GI -CBC q6h -continuous LR 125 cc/hr -IV protonix -palliative consult following.  Acute on Chronic Kidney Injury IIIB Cr of 1.97>1.75. Suspect pre-renal etiology in  setting of severe hypovolemia -fluid resuscitating as per above.   Malnutrition 2/2 Advanced [place] Patient with temporal wasting, frail. Patient with decreased appetite recently. Albumin of 1.7. -nutrition consult placed  Hypokalemia 2.9 from 4.2 -40 meq KCl BID  Diet: Clear Liquids IVF: LR,125cc/hr VTE: None Code: DNR  Dispo: Anticipated discharge to [facility] pending further workup and further goals of care discussions  [facility] Resident PGY-1 Pager [phone] Please contact the on call pager after 5 pm and on weekends at [phone].

## 2020-10-02 NOTE — Plan of Care (Signed)

## 2020-10-02 NOTE — Op Note (Signed)
[facility] Patient Name: Scott Farmer Procedure Date : 10/02/2020 MRN: [id number] Attending MD: Docia Chuck. Henrene Pastor , MD Date of Birth: 06-15-1956 CSN: [id number] Age: 65 Admit Type: Inpatient Procedure:                Colonoscopy with hemostatic therapy (APC of cecal                            AVM) Indications:              Hematochezia. Had unremarkable EGD yesterday.                            Colonoscopy yesterday limited due to poor prep.                            Found to have blood throughout the colon, but not                            the ileum. Now for repeat colonoscopy after                            additional preparation. Providers:                Docia Chuck. Henrene Pastor, MD, Nelia Shi, RN,                            Doristine Johns, RN, Tyna Jaksch Technician Referring MD:             [facility] hospitalist Medicines:                Monitored Anesthesia Care Complications:            No immediate complications. Estimated blood loss:                            None. Estimated Blood Loss:     Estimated blood loss: none. Procedure:                Pre-Anesthesia Assessment:                           - Prior to the procedure, a History and Physical                            was performed, and patient medications and                            allergies were reviewed. The patient's tolerance of                            previous anesthesia was also reviewed. The risks                            and benefits of the procedure and the sedation                            options  and risks were discussed with the patient.                            All questions were answered, and informed consent                            was obtained. Prior Anticoagulants: The patient has                            taken no previous anticoagulant or antiplatelet                            agents. ASA Grade Assessment: III - A patient with                            severe systemic  disease. After reviewing the risks                            and benefits, the patient was deemed in                            satisfactory condition to undergo the procedure.                           After obtaining informed consent, the colonoscope                            was passed under direct vision. Throughout the                            procedure, the patient's blood pressure, pulse, and                            oxygen saturations were monitored continuously. The                            [id number] Olympus colonoscope was                            introduced through the anus and advanced to the the                            cecum, identified by the appendiceal orifice,                            ileocecal valve and palpation. The ileocecal valve,                            appendiceal orifice, and rectum were photographed.                            The quality of the bowel preparation was excellent.  The colonoscopy was performed without difficulty.                            The patient tolerated the procedure well. The bowel                            preparation used was MoviPrep via split dose                            instruction. Scope In: 9:51:42 AM Scope Out: 10:09:14 AM Scope Withdrawal Time: 0 hours 8 minutes 17 seconds  Total Procedure Duration: 0 hours 17 minutes 32 seconds  Findings:      A single large angiodysplastic lesion without bleeding was found in the       cecum. Coagulation for bleeding prevention using argon beam was       successful.      Internal hemorrhoids were found during retroflexion.      The exam was otherwise without abnormality on direct and retroflexion       views. There was no blood or active bleeding in the colon. Impression:               - A single non-bleeding colonic angiodysplastic                            lesion. Treated with argon beam coagulation.                           -  Internal hemorrhoids.                           - The examination was otherwise normal on direct                            and retroflexion views.                           - Recent GI bleeding has stopped. Recommendation:           1. Resume diet                           2. Observe overnight for any evidence of recurrent                            bleeding                           3. Transfuse to desired hemoglobin                           4. Okay to discharge home in a.m. if stable                           5. Routine GI outpatient follow-up not required.                           We will sign off. Please call  for questions or                            problems. Thanks Procedure Code(s):        --- Professional ---                           250 011 1918, Colonoscopy, flexible; with control of                            bleeding, any method Diagnosis Code(s):        --- Professional ---                           K55.20, Angiodysplasia of colon without hemorrhage                           K64.8, Other hemorrhoids                           K92.1, Melena (includes Hematochezia) CPT copyright 2019 American [employer] Association. All rights reserved. The codes documented in this report are preliminary and upon coder review may  be revised to meet current compliance requirements. Docia Chuck. Henrene Pastor, MD 10/02/2020 10:23:55 AM This report has been signed electronically. Number of Addenda: 0

## 2020-10-02 NOTE — Progress Notes (Signed)
Daily Progress Note   Patient Name: Scott Farmer       Date: 10/02/2020 DOB: 1955/11/27  Age: 65 y.o. MRN#: [id number] Attending Physician: Lucious Groves, DO Primary Care Physician: Cyndi Bender, PA-C Admit Date: 09/30/2020  Reason for Consultation/Follow-up: Disposition and Establishing goals of care  Subjective: Chart review performed. Received report from primary RN - no acute concerns - states patient did well after colonoscopy.   Went to visit patient at bedside - no family/visitors present. Patient was lying in bed awake, alert, oriented, and able to participate in conversation. Patient states he is having discomfort in his back and has already notified RN. No respiratory distress, increased work of breathing, or secretions noted.   Patient states he was about to order dinner - expresses he does feel hungry and is ready to eat. Scott Farmer states that he feels "so much better" today than when he first arrived at the hospital. Therapeutic listening provided as patient reflects on his hospital stay, stating he received excellent care. He also expresses joy that many of his family members are coming to see him at home this weekend - he states "I am excited to go home" and "I am excited to shave my face." Discussed that if his labs remain stable overnight, the plan is for him to discharge home tomorrow morning - patient has already discussed this with his wife - patient states she has gotten everything ready for his return. I asked the patient is DME had been delivered - Scott Farmer states it is scheduled for delivery.   Scott Farmer states he was appreciative of chaplain visit this afternoon and is at peace with his current situation - stating that with the circumstances he has lots of  blessings.  All questions and concerns addressed. Encouraged to call with questions and/or concerns. PMT card provided.  Notified RN that patient was experiencing discomfort in his back - she will administer medications appropriately.    Length of Stay: 2  Current Medications: Scheduled Meds:  . sodium chloride   Intravenous Once  . Chlorhexidine Gluconate Cloth  6 each Topical Daily  . [START ON 10/03/2020] feeding supplement  237 mL Oral BID BM  . multivitamin with minerals  1 tablet Oral Daily  . nicotine  14 mg Transdermal Daily  . potassium chloride  40 mEq Oral BID    Continuous Infusions: . sodium chloride    . lactated ringers Stopped (10/02/20 1031)    PRN Meds: HYDROmorphone HCl, lip balm, sodium chloride flush  Physical Exam Vitals and nursing note reviewed.  Constitutional:      General: He is not in acute distress. Pulmonary:     Effort: No respiratory distress.  Skin:    General: Skin is warm and dry.  Neurological:     Mental Status: He is alert and oriented to person, place, and time.     Motor: Weakness present.  Psychiatric:        Attention and Perception: Attention normal.        Behavior: Behavior is cooperative.        Cognition and Memory: Cognition and memory normal.             Vital Signs: BP 101/62 (BP Location: Right Arm)   Pulse 81   Temp 98.7 F (37.1 C) (Oral)   Resp 16   Ht 5\' 10"  (1.778 m)   Wt 79.2 kg   SpO2 96%   BMI 25.04 kg/m  SpO2: SpO2: 96 % O2 Device: O2 Device: Room Air O2 Flow Rate: O2 Flow Rate (L/min): 2 L/min  Intake/output summary:   Intake/Output Summary (Last 24 hours) at 10/02/2020 1731 Last data filed at 10/02/2020 1729 Gross per 24 hour  Intake 350 ml  Output 1652 ml  Net -1302 ml   LBM: Last BM Date: 10/02/20 Baseline Weight: Weight: 79.2 kg Most recent weight: Weight: 79.2 kg       Palliative Assessment/Data: PPS 30%      Patient Active Problem List   Diagnosis Date Noted  . Hematochezia    . Angiodysplasia of cecum   . GI bleed 09/30/2020  . Nausea and vomiting 09/21/2020  . Liver cirrhosis ([place]) 07/11/2020  . Anemia, unspecified 07/11/2020  . History of hepatitis C 07/11/2020  . Hepatocellular carcinoma ([place]) 06/18/2020  . Coagulopathy ([place]) 10/23/2019  . Iron deficiency anemia 10/23/2019  . 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 He was brought to the emergency roomon1/22/2022with nausea/vomiting, syncopal episode, and fall from toilet.Also reporting 2-3 days of bloody bowel movements.  ED Course: Hypothermic with temperature as low as 94, warming blanket applied.Blood pressure low, treated with several fluid boluses. Protonix infusion initiated.Hemoglobin 6.1, lactic acid 10.8, albumin 1.7. CT head negative for acute intracranial abnormality. GI consulted. Patient admitted to IM teaching service for management of acute GI bleed in the setting of underlying advanced hepatocellular carcinoma.  Assessment: Hepatocellular carcinoma Acute GI bleed Weakness Syncope Acute on chronic kidney injury Malnutrition secondary to advanced cancer  Recommendations/Plan:  Continue current medical treatment  Continue DNR/DNI as previously documented  Plan for discharge home with hospice once medically stable - [facility]  PMT will continue to follow peripherally. If there are any imminent needs please call the service directly   Goals of Care and Additional Recommendations:  Limitations on Scope of Treatment: Full Scope Treatment, No Chemotherapy, No Hemodialysis and No Tracheostomy  Code Status:    Code Status Orders  (From admission, onward)         Start      Ordered   09/30/20 1712  Do not attempt resuscitation (  DNR)  Continuous       Question Answer Comment  In the event of cardiac or respiratory ARREST Do not call a "code blue"   In the event of cardiac or respiratory ARREST Do not perform Intubation, CPR, defibrillation or ACLS   In the event of cardiac or respiratory ARREST Use medication by any route, position, wound care, and other measures to relive pain and suffering. May use oxygen, suction and manual treatment of airway obstruction as needed for comfort.      09/30/20 1711        Code Status History    Date Active Date Inactive Code Status Order ID Comments User Context   09/30/2020 1319 09/30/2020 1711 Full Code [id number]  Maudie Mercury, MD ED   Advance Care Planning Activity       Prognosis:   Weeks to months  Discharge Planning:  Home with Hospice  Care plan was discussed with primary RN, patient  Thank you for allowing the Palliative Medicine Team to assist in the care of this patient.   Total Time 15 minutes Prolonged Time Billed  no       Greater than 50%  of this time was spent counseling and coordinating care related to the above assessment and plan.  Lin Landsman, NP  Please contact Palliative Medicine Team phone at [phone] for questions and concerns.

## 2020-10-02 NOTE — Transfer of Care (Signed)
Immediate Anesthesia Transfer of Care Note  Patient: Scott Farmer  Procedure(s) Performed: COLONOSCOPY WITH PROPOFOL (N/A ) HOT HEMOSTASIS (ARGON PLASMA COAGULATION/BICAP) (N/A )  Patient Location: Endoscopy Unit  Anesthesia Type:MAC  Level of Consciousness: sedated and responds to stimulation  Airway & Oxygen Therapy: Patient Spontanous Breathing and Patient connected to face mask oxygen  Post-op Assessment: Report given to RN, Post -op Vital signs reviewed and stable and Patient moving all extremities  Post vital signs: Reviewed and stable  Last Vitals:  Vitals Value Taken Time  BP    Temp    Pulse    Resp 18 10/02/20 1015  SpO2    Vitals shown include unvalidated device data.  Last Pain:  Vitals:   10/02/20 0746  TempSrc: Oral  PainSc: 8       Patients Stated Pain Goal: 3 (85/02/77 4128)  Complications: No complications documented.

## 2020-10-02 NOTE — Progress Notes (Signed)
This chaplain responded to PMT consult for spiritual care.  The Pt. is awake and welcoming.  The Pt. asks for his lunch tray to be moved. The Pt. kept the juice and ate the ice cream.    The Pt. spirituality is easily identifies through his story the places the Pt. sees and feels God's love in his life. The chaplain understands the Pt. is ready to share the same love with his family as they arrive from [place].  The chaplain and Pt. have planned a F/U spiritual care visit for Tuesday and ended today's visit with prayer.

## 2020-10-02 NOTE — Telephone Encounter (Signed)
I spoke with her and filled her in regarding her husband's colonoscopy.  She was grateful

## 2020-10-02 NOTE — Interval H&P Note (Signed)
History and Physical Interval Note:  10/02/2020 9:36 AM  [name]  has presented today for surgery, with the diagnosis of hematochezia, acute blood loss anemia.  The various methods of treatment have been discussed with the patient and family. After consideration of risks, benefits and other options for treatment, the patient has consented to  Procedure(s): COLONOSCOPY WITH PROPOFOL (N/A) as a surgical intervention.  The patient's history has been reviewed, patient examined, no change in status, stable for surgery.  I have reviewed the patient's chart and labs.  Questions were answered to the patient's satisfaction.     Scott Farmer

## 2020-10-03 ENCOUNTER — Inpatient Hospital Stay: Payer: Medicare Other | Admitting: Oncology

## 2020-10-03 ENCOUNTER — Other Ambulatory Visit: Payer: Self-pay | Admitting: Hematology and Oncology

## 2020-10-03 DIAGNOSIS — Z299 Encounter for prophylactic measures, unspecified: Secondary | ICD-10-CM

## 2020-10-03 LAB — BASIC METABOLIC PANEL
Anion gap: 8 (ref 5–15)
BUN: 20 mg/dL (ref 8–23)
CO2: 23 mmol/L (ref 22–32)
Calcium: 7.4 mg/dL — ABNORMAL LOW (ref 8.9–10.3)
Chloride: 107 mmol/L (ref 98–111)
Creatinine, Ser: 1.53 mg/dL — ABNORMAL HIGH (ref 0.61–1.24)
GFR, Estimated: 50 mL/min — ABNORMAL LOW (ref >60)
Glucose, Bld: 123 mg/dL — ABNORMAL HIGH (ref 70–99)
Potassium: 3 mmol/L — ABNORMAL LOW (ref 3.5–5.1)
Sodium: 138 mmol/L (ref 135–145)

## 2020-10-03 LAB — CBC
HCT: 19 % — ABNORMAL LOW (ref 39.0–52.0)
HCT: 20.5 % — ABNORMAL LOW (ref 39.0–52.0)
Hemoglobin: 6.7 g/dL — CL (ref 13.0–17.0)
Hemoglobin: 7.3 g/dL — ABNORMAL LOW (ref 13.0–17.0)
MCH: 32.1 pg (ref 26.0–34.0)
MCH: 32.2 pg (ref 26.0–34.0)
MCHC: 35.3 g/dL (ref 30.0–36.0)
MCHC: 35.6 g/dL (ref 30.0–36.0)
MCV: 90.3 fL (ref 80.0–100.0)
MCV: 90.9 fL (ref 80.0–100.0)
Platelets: 137 10*3/uL — ABNORMAL LOW (ref 150–400)
Platelets: 145 10*3/uL — ABNORMAL LOW (ref 150–400)
RBC: 2.09 MIL/uL — ABNORMAL LOW (ref 4.22–5.81)
RBC: 2.27 MIL/uL — ABNORMAL LOW (ref 4.22–5.81)
RDW: 17.7 % — ABNORMAL HIGH (ref 11.5–15.5)
RDW: 17.9 % — ABNORMAL HIGH (ref 11.5–15.5)
WBC: 11 10*3/uL — ABNORMAL HIGH (ref 4.0–10.5)
WBC: 11.2 10*3/uL — ABNORMAL HIGH (ref 4.0–10.5)
nRBC: 0.5 % — ABNORMAL HIGH (ref 0.0–0.2)
nRBC: 0.6 % — ABNORMAL HIGH (ref 0.0–0.2)

## 2020-10-03 LAB — PREPARE RBC (CROSSMATCH)

## 2020-10-03 LAB — HEMOGLOBIN AND HEMATOCRIT, BLOOD
HCT: 22.6 % — ABNORMAL LOW (ref 39.0–52.0)
Hemoglobin: 7.6 g/dL — ABNORMAL LOW (ref 13.0–17.0)

## 2020-10-03 MED ORDER — SODIUM CHLORIDE 0.9% IV SOLUTION: Freq: Once | INTRAVENOUS | Status: DC

## 2020-10-03 MED ORDER — LIP MEDEX EX OINT: TOPICAL_OINTMENT | CUTANEOUS | 0 refills | Status: AC | PRN

## 2020-10-03 MED ORDER — POTASSIUM CHLORIDE 10 MEQ/100ML IV SOLN
10.0000 meq | INTRAVENOUS | Status: AC
  Administered 2020-10-03 (×4): 10 meq via INTRAVENOUS
  Filled 2020-10-03 (×4): qty 100

## 2020-10-03 MED ORDER — HYDROMORPHONE HCL 1 MG/ML IJ SOLN
1.0000 mg | INTRAMUSCULAR | Status: DC | PRN
  Administered 2020-10-03 (×3): 1 mg via INTRAVENOUS
  Filled 2020-10-03 (×3): qty 1

## 2020-10-03 MED ORDER — NICOTINE 14 MG/24HR TD PT24: 14.0000 mg | MEDICATED_PATCH | Freq: Every day | TRANSDERMAL | 0 refills | Status: AC

## 2020-10-03 MED ORDER — HYDROMORPHONE HCL 1 MG/ML IJ SOLN: 1.0000 mg | Freq: Once | INTRAMUSCULAR | Status: DC

## 2020-10-03 NOTE — Telephone Encounter (Signed)
sent 

## 2020-10-03 NOTE — Progress Notes (Signed)
CRITICAL VALUE ALERT  Critical Value:  Hgb 6.7  Date & Time Notified: 10/03/20 4:55  Provider Notified:  Allyson Sabal  Orders Received/Actions taken: Transfuse 1 unit RBC

## 2020-10-03 NOTE — Progress Notes (Signed)
This chaplain is present for F/U spiritual care with the Pt.  The chaplain understands d/c may happen around 5pm today.    The chaplain understands the Pt. thoughts are on the d/c and the pleasures of home.  The Pt. and chaplain exchanged prayers and blessings.  This chaplain is available for F/U spiritual care as needed.

## 2020-10-03 NOTE — Plan of Care (Signed)

## 2020-10-03 NOTE — Plan of Care (Signed)
Problem: Education: Goal: Knowledge of General Education information will improve Description: Including pain rating scale, medication(s)/side effects and non-pharmacologic comfort measures 10/03/2020 1345 by Camillia Herter, RN Outcome: Adequate for Discharge 10/03/2020 0810 by Camillia Herter, RN Outcome: Progressing   Problem: Health Behavior/Discharge Planning: Goal: Ability to manage health-related needs will improve 10/03/2020 1345 by Camillia Herter, RN Outcome: Adequate for Discharge 10/03/2020 0810 by Camillia Herter, RN Outcome: Progressing   Problem: Clinical Measurements: Goal: Ability to maintain clinical measurements within normal limits will improve 10/03/2020 1345 by Camillia Herter, RN Outcome: Adequate for Discharge 10/03/2020 0810 by Camillia Herter, RN Outcome: Progressing Goal: Will remain free from infection 10/03/2020 1345 by Camillia Herter, RN Outcome: Adequate for Discharge 10/03/2020 0810 by Camillia Herter, RN Outcome: Progressing Goal: Diagnostic test results will improve 10/03/2020 1345 by Camillia Herter, RN Outcome: Adequate for Discharge 10/03/2020 0810 by Camillia Herter, RN Outcome: Progressing Goal: Respiratory complications will improve 10/03/2020 1345 by Camillia Herter, RN Outcome: Adequate for Discharge 10/03/2020 0810 by Camillia Herter, RN Outcome: Progressing Goal: Cardiovascular complication will be avoided 10/03/2020 1345 by Camillia Herter, RN Outcome: Adequate for Discharge 10/03/2020 0810 by Camillia Herter, RN Outcome: Progressing   Problem: Activity: Goal: Risk for activity intolerance will decrease 10/03/2020 1345 by Camillia Herter, RN Outcome: Adequate for Discharge 10/03/2020 0810 by Camillia Herter, RN Outcome: Progressing   Problem: Nutrition: Goal: Adequate nutrition will be maintained 10/03/2020 1345 by Camillia Herter, RN Outcome: Adequate for Discharge 10/03/2020 0810 by Camillia Herter, RN Outcome: Progressing   Problem:  Coping: Goal: Level of anxiety will decrease 10/03/2020 1345 by Camillia Herter, RN Outcome: Adequate for Discharge 10/03/2020 0810 by Camillia Herter, RN Outcome: Progressing   Problem: Elimination: Goal: Will not experience complications related to bowel motility 10/03/2020 1345 by Camillia Herter, RN Outcome: Adequate for Discharge 10/03/2020 0810 by Camillia Herter, RN Outcome: Progressing Goal: Will not experience complications related to urinary retention 10/03/2020 1345 by Camillia Herter, RN Outcome: Adequate for Discharge 10/03/2020 0810 by Camillia Herter, RN Outcome: Progressing   Problem: Pain Managment: Goal: General experience of comfort will improve 10/03/2020 1345 by Camillia Herter, RN Outcome: Adequate for Discharge 10/03/2020 0810 by Camillia Herter, RN Outcome: Progressing   Problem: Safety: Goal: Ability to remain free from injury will improve 10/03/2020 1345 by Camillia Herter, RN Outcome: Adequate for Discharge 10/03/2020 0810 by Camillia Herter, RN Outcome: Progressing   Problem: Skin Integrity: Goal: Risk for impaired skin integrity will decrease 10/03/2020 1345 by Camillia Herter, RN Outcome: Adequate for Discharge 10/03/2020 0810 by Camillia Herter, RN Outcome: Progressing   Problem: Education: Goal: Ability to identify signs and symptoms of gastrointestinal bleeding will improve 10/03/2020 1345 by Camillia Herter, RN Outcome: Adequate for Discharge 10/03/2020 0810 by Camillia Herter, RN Outcome: Progressing   Problem: Bowel/Gastric: Goal: Will show no signs and symptoms of gastrointestinal bleeding 10/03/2020 1345 by Camillia Herter, RN Outcome: Adequate for Discharge 10/03/2020 0810 by Camillia Herter, RN Outcome: Progressing   Problem: Fluid Volume: Goal: Will show no signs and symptoms of excessive bleeding 10/03/2020 1345 by Camillia Herter, RN Outcome: Adequate for Discharge 10/03/2020 0810 by Camillia Herter, RN Outcome: Progressing   Problem: Clinical  Measurements: Goal: Complications related to the disease process, condition or treatment will be avoided or minimized 10/03/2020 1345 by Camillia Herter, RN Outcome:  Adequate for Discharge 10/03/2020 0810 by Camillia Herter, RN Outcome: Progressing

## 2020-10-03 NOTE — Discharge Instructions (Signed)
Thank you for allowing me to care for you Scott Farmer. You were admitted for nausea, weakness, and possible GI bleed. You had three procedures done (two colonoscopies and an endoscopy) looking for the source of the bleeding. You were given multiple units of blood during your admission as well. You and your family made the decision to have home hospice care. This has been setup for you by our care management team and the hospice nurse should be at the house today.   No medication changes were made at your discharge.     Gastrointestinal Bleeding Gastrointestinal (GI) bleeding is bleeding somewhere along the path that food travels through the body (digestive tract). This path is anywhere between the mouth and the opening of the butt (anus). You may have blood in your poop (stool) or have black poop. If you throw up (vomit), there may be blood in it. This condition can be mild, serious, or even life-threatening. If you have a lot of bleeding, you may need to stay in the hospital. What are the causes? This condition may be caused by:  Irritation and swelling of the esophagus (esophagitis). The esophagus is part of the body that moves food from your mouth to your stomach.  Swollen veins in the butt (hemorrhoids).  Areas of painful tearing in the opening of the butt (anal fissures). These are often caused by passing hard poop.  Pouches that form on the colon over time (diverticulosis).  Irritation and swelling (diverticulitis) in areas where pouches have formed on the colon.  Growths (polyps) or cancer. Colon cancer often starts out as growths that are not cancer.  Irritation of the stomach lining (gastritis).  Sores (ulcers) in the stomach. What increases the risk? You are more likely to develop this condition if you:  Have a certain type of infection in your stomach (Helicobacter pylori infection).  Take certain medicines.  Smoke.  Drink alcohol. What are the signs or  symptoms? Common symptoms of this condition include:  Throwing up (vomiting) material that has bright red blood in it. It may look like coffee grounds.  Changes in your poop. The poop may: ? Have red blood in it. ? Be black, look like tar, and smell stronger than normal. ? Be red.  Pain or cramping in the belly (abdomen). How is this treated? Treatment for this condition depends on the cause of the bleeding. For example:  Sometimes, the bleeding can be stopped during a procedure that is done to find the problem (endoscopy or colonoscopy).  Medicines can be used to: ? Help control irritation, swelling, or infection. ? Reduce acid in your stomach.  Certain problems can be treated with: ? Creams. ? Medicines that are put in the butt (suppositories). ? Warm baths.  Surgery is sometimes needed.  If you lose a lot of blood, you may need a blood transfusion. If bleeding is mild, you may be allowed to go home. If there is a lot of bleeding, you will need to stay in the hospital. Follow these instructions at home:  Take over-the-counter and prescription medicines only as told by your doctor.  Eat foods that have a lot of fiber in them. These foods include beans, whole grains, and fresh fruits and vegetables. You can also try eating 1-3 prunes each day.  Drink enough fluid to keep your pee (urine) pale yellow.  Keep all follow-up visits as told by your doctor. This is important.   Contact a doctor if:  Your symptoms do not  get better. Get help right away if:  Your bleeding does not stop.  You feel dizzy or you pass out (faint).  You feel weak.  You have very bad cramps in your back or belly.  You pass large clumps of blood (clots) in your poop.  Your symptoms are getting worse.  You have chest pain or fast heartbeats. Summary  GI bleeding is bleeding somewhere along the path that food travels through the body (digestive tract).  This bleeding can be caused by many  things. Treatment depends on the cause of the bleeding.  Take medicines only as told by your doctor.  Keep all follow-up visits as told by your doctor. This is important. This information is not intended to replace advice given to you by your health care provider. Make sure you discuss any questions you have with your health care provider. Document Revised: 04/08/2018 Document Reviewed: 04/08/2018 Elsevier Patient Education  [place].

## 2020-10-03 NOTE — Discharge Summary (Signed)
Name: Scott Farmer MRN: [id number] DOB: 10/18/1955 65 y.o. PCP: Cyndi Bender, PA-C  Date of Admission: 09/30/2020  7:50 AM Date of Discharge: 10/03/2020 Attending Physician: Dr. Angelia Mould  Discharge Diagnosis: Active Problems:   GI bleed   Hematochezia   Angiodysplasia of cecum    Discharge Medications: Allergies as of 10/03/2020      Reactions   Hydromorphone Itching   Lortab [hydrocodone-acetaminophen] Itching      Medication List    TAKE these medications   cetirizine 10 MG tablet Commonly known as: ZYRTEC TAKE 1 TABLET BY MOUTH EVERY DAY   HYDROmorphone 2 MG tablet Commonly known as: DILAUDID Take 1 tablet (2 mg total) by mouth every 4 (four) hours as needed for severe pain.   hydrOXYzine 50 MG capsule Commonly known as: VISTARIL Take 1 capsule (50 mg total) by mouth 3 (three) times daily as needed. What changed: reasons to take this   lip balm ointment Apply topically as needed for lip care.   LORazepam 1 MG tablet Commonly known as: ATIVAN TAKE 1 TABLET BY MOUTH EVERY 8 HOURS AS NEEDED FOR ANXIETY   nicotine 14 mg/24hr patch Commonly known as: NICODERM CQ - dosed in mg/24 hours Place 1 patch (14 mg total) onto the skin daily. Start taking on: October 04, 2020   nystatin-triamcinolone ointment Commonly known as: MYCOLOG Apply 1 application topically 2 (two) times daily.   Oxycodone HCl 10 MG Tabs Take 10 mg by mouth every 4 (four) hours as needed (pain).       Disposition and follow-up:   Mr.Scott Farmer was discharged from [facility] to hospice care in stable condition.    1.  Follow-up:  [facility] for his advanced hepatocellular carcinoma  2.  Labs / imaging needed at time of follow-up: None  3.  Pending labs/ test needing follow-up: None  4.  Medication Changes  Started: None  Stopped: None  Abx - None   Follow-up Appointments:  Follow-up Information    Cyndi Bender, PA-C Follow up.    Specialty: Physician Assistant Contact information: [place] [address] [phone]        [facility] Follow up.   Specialty: PALLIATIVE CARE Why: home hosice referral Contact information: [address] [place] [id number] [place] Hospital Course by problem list:   Syncope 2/2 Acute GI Bleed Hepatocellular Carcinoma Malnutrition Upon admission, patient presented to ED from home after having three day of weakness and darkened stools. Day of admission, patient with bright red blood and coffee ground emesis. Had syncopal episode after walking to the bathroom. Unwitnessed fall. Head CT negative for acute bleed. Initial Hgb of 6.1. Patient transfused with 2 units initally. GI consulted who performed EGD/Colonoscopy.  EGD/Colonscopy without initial evidence of acute bleeding, however, patient was unable to consume full bowel prep and repeat colonoscopy was performed the following day. Angiodysplastic lesion found that was treated with argon beam coagulation. CT Abdomen and pelvis without signs of peritoneal bleeding from tumor. Etiology of anemia thought to be multifactorial in setting of advanced malignancy, CKD, and malnutrition. Malnutrition thought to be secondary to advancement of disease. Prior to discharge patient required 2 units of blood. Palliative care were consulted on hospital day 1, met with patient and family. Decision was made to transition patient to DNR and after further discussions to home hospice. Patient was  discharged home with home hospice care in place.   Discharge Vitals:   BP (!) 86/62 (BP Location: Right Arm)   Pulse 81   Temp 97.6 F (36.4 C) (Oral)   Resp 15   Ht _0  (1.778 m)   Wt 79.2 kg   SpO2 98%   BMI 25.04 kg/m    Physical Exam At Day of Discharge Constitutional: Ill/toxic appearing HENT: temporal wasting Eyes: conjunctiva jaundiced Neck: supple Cardiovascular:  regular rate and rhythm Pulmonary/Chest: normal work of breathing on room air MSK: thin,  Neurological: alert & oriented x 3 Skin: cool, jaundiced Psych: Normal mood  Pertinent Labs, Studies, and Procedures:  CBC Latest Ref Rng & Units 10/03/2020 10/03/2020 10/03/2020  WBC 4.0 - 10.5 K/uL - 11.0(H) 11.2(H)  Hemoglobin 13.0 - 17.0 g/dL 7.6(L) 6.7(LL) 7.3(L)  Hematocrit 39.0 - 52.0 % 22.6(L) 19.0(L) 20.5(L)  Platelets 150 - 400 K/uL - 137(L) 145(L)    CMP Latest Ref Rng & Units 10/03/2020 10/02/2020 10/01/2020  Glucose 70 - 99 mg/dL 123(H) 113(H) 130(H)  BUN 8 - 23 mg/dL 20 24(H) 26(H)  Creatinine 0.61 - 1.24 mg/dL 1.53(H) 1.75(H) 1.97(H)  Sodium 135 - 145 mmol/L 138 140 142  Potassium 3.5 - 5.1 mmol/L 3.0(L) 2.9(L) 4.2  Chloride 98 - 111 mmol/L 107 109 108  CO2 22 - 32 mmol/L _1 Calcium 8.9 - 10.3 mg/dL 7.4(L) 7.5(L) 8.1(L)  Total Protein 6.5 - 8.1 g/dL - - -  Total Bilirubin 0.3 - 1.2 mg/dL - - -  Alkaline Phos 38 - 126 U/L - - -  AST 15 - 41 U/L - - -  ALT 0 - 44 U/L - - -    CT ABDOMEN PELVIS WO CONTRAST  Result Date: 09/30/2020 CLINICAL DATA:  65 year old male with hepatobiliary carcinoma and GI bleed. EXAM: CT ABDOMEN AND PELVIS WITHOUT CONTRAST TECHNIQUE: Multidetector CT imaging of the abdomen and pelvis was performed following the standard protocol without IV contrast. COMPARISON:  CT abdomen pelvis dated 05/30/2020. FINDINGS: Evaluation of this exam is limited in the absence of intravenous contrast. Evaluation is also limited due to anasarca and loss of abdominal fat. Lower chest: Trace right pleural effusions. There are bibasilar linear atelectasis/scarring. Irregular nodular density along the right posterior pleural surface may be related to atelectasis. Pleural metastatic disease is not excluded. There is coronary vascular calcification. There is hypoattenuation of the cardiac blood pool suggestive of anemia. Clinical correlation is recommended. No intra-abdominal free  air. Small ascites. Hepatobiliary: Multiple hepatic hypodense lesions with near complete replacement of the left lobe of the liver in keeping with known malignancy. No gallstone. Pancreas: The pancreas is poorly visualized. There is mild atrophic appearance of the body and tail of the pancreas similar to prior CT. Spleen: Normal in size without focal abnormality. Adrenals/Urinary Tract: The adrenal glands unremarkable. There is no hydronephrosis or nephrolithiasis on either side. The urinary bladder is partially distended and grossly unremarkable. Stomach/Bowel: Evaluation of the bowel is limited in the absence of oral contrast and due to limiting factors mentioned above. There is diffuse thickened appearance of the colon most concerning for pancolitis. Correlation with clinical exam and stool cultures recommended. No evidence of bowel obstruction. The appendix is poorly visualized. Vascular/Lymphatic: Moderate aortoiliac atherosclerotic disease. The IVC is grossly unremarkable. No portal venous gas. No adenopathy. Reproductive: The prostate and seminal vesicles are grossly unremarkable. Small right inguinal hernia. Other: Diffuse subcutaneous edema and anasarca. Musculoskeletal: No acute or significant osseous findings. IMPRESSION: 1.  Pancolitis. Correlation with clinical exam and stool cultures recommended. No bowel obstruction. 2. Multiple hepatic hypodense lesions with near complete replacement of the left lobe of the liver in keeping with known malignancy. 3. Small ascites, diffuse subcutaneous edema, and anasarca. 4. Aortic Atherosclerosis (ICD10-I70.0). Electronically Signed   By: Anner Crete M.D.   On: 09/30/2020 19:07   CT HEAD WO CONTRAST  Result Date: 09/30/2020 CLINICAL DATA:  Unwitnessed fall, syncope, head and neck injury, history of hepatocellular carcinoma EXAM: CT HEAD WITHOUT CONTRAST CT CERVICAL SPINE WITHOUT CONTRAST TECHNIQUE: Multidetector CT imaging of the head and cervical spine was  performed following the standard protocol without intravenous contrast. Multiplanar CT image reconstructions of the cervical spine were also generated. COMPARISON:  10/12/2019 head CT FINDINGS: CT HEAD FINDINGS Brain: Mild chronic white matter microvascular ischemic changes about the lateral ventricles. No acute intracranial hemorrhage, mass lesion, new infarction, midline shift, herniation, hydrocephalus, or extra-axial fluid collection. No focal mass effect or edema. Cisterns are patent. No gross cerebellar abnormality. Vascular: No hyperdense vessel or unexpected calcification. Skull: Normal. Negative for fracture or focal lesion. Sinuses/Orbits: Orbits unremarkable. Scattered mild sinus mucosal thickening. Other: None. CT CERVICAL SPINE FINDINGS Alignment: Normal. Skull base and vertebrae: No acute fracture. No primary bone lesion or focal pathologic process. Soft tissues and spinal canal: Normal prevertebral soft tissues. Multifactorial mild acquired spinal stenosis at C4-5, C5-6, and C6-7. Disc levels: Mild mid and lower cervical degenerative disc disease. Facets are aligned. No subluxation or dislocation. Upper chest: Apical emphysema noted. Other: Right IJ port catheter present. Carotid and aortic atherosclerosis present. IMPRESSION: No acute intracranial abnormality by noncontrast CT. Stable minor white matter microvascular ischemic changes. Cervical degenerative changes as above. No acute cervical spine fracture or malalignment by CT. Aortic Atherosclerosis (ICD10-I70.0) and Emphysema (ICD10-J43.9). Electronically Signed   By: Jerilynn Mages.  Shick M.D.   On: 09/30/2020 09:41   CT CERVICAL SPINE WO CONTRAST  Result Date: 09/30/2020 CLINICAL DATA:  Unwitnessed fall, syncope, head and neck injury, history of hepatocellular carcinoma EXAM: CT HEAD WITHOUT CONTRAST CT CERVICAL SPINE WITHOUT CONTRAST TECHNIQUE: Multidetector CT imaging of the head and cervical spine was performed following the standard protocol without  intravenous contrast. Multiplanar CT image reconstructions of the cervical spine were also generated. COMPARISON:  10/12/2019 head CT FINDINGS: CT HEAD FINDINGS Brain: Mild chronic white matter microvascular ischemic changes about the lateral ventricles. No acute intracranial hemorrhage, mass lesion, new infarction, midline shift, herniation, hydrocephalus, or extra-axial fluid collection. No focal mass effect or edema. Cisterns are patent. No gross cerebellar abnormality. Vascular: No hyperdense vessel or unexpected calcification. Skull: Normal. Negative for fracture or focal lesion. Sinuses/Orbits: Orbits unremarkable. Scattered mild sinus mucosal thickening. Other: None. CT CERVICAL SPINE FINDINGS Alignment: Normal. Skull base and vertebrae: No acute fracture. No primary bone lesion or focal pathologic process. Soft tissues and spinal canal: Normal prevertebral soft tissues. Multifactorial mild acquired spinal stenosis at C4-5, C5-6, and C6-7. Disc levels: Mild mid and lower cervical degenerative disc disease. Facets are aligned. No subluxation or dislocation. Upper chest: Apical emphysema noted. Other: Right IJ port catheter present. Carotid and aortic atherosclerosis present. IMPRESSION: No acute intracranial abnormality by noncontrast CT. Stable minor white matter microvascular ischemic changes. Cervical degenerative changes as above. No acute cervical spine fracture or malalignment by CT. Aortic Atherosclerosis (ICD10-I70.0) and Emphysema (ICD10-J43.9). Electronically Signed   By: Jerilynn Mages.  Shick M.D.   On: 09/30/2020 09:41   DG Chest Port 1 View  Result Date: 09/30/2020 CLINICAL DATA:  Weakness EXAM:  PORTABLE CHEST 1 VIEW COMPARISON:  07/20/2020 FINDINGS: Porta catheter with tip at the SVC. Streaky density at the bases which is improved from before and remaining likely reflecting scarring. There is no edema, consolidation, effusion, or pneumothorax. Extensive artifact from EKG leads. IMPRESSION: No acute  finding. Mild scarring at the lung bases. Electronically Signed   By: Monte Fantasia M.D.   On: 09/30/2020 09:38     Discharge Instructions: Discharge Instructions    Call MD for:  difficulty breathing, headache or visual disturbances   Complete by: As directed    Call MD for:  extreme fatigue   Complete by: As directed    Call MD for:  persistant dizziness or light-headedness   Complete by: As directed    Call MD for:  persistant nausea and vomiting   Complete by: As directed    Call MD for:  redness, tenderness, or signs of infection (pain, swelling, redness, odor or green/yellow discharge around incision site)   Complete by: As directed    Call MD for:  severe uncontrolled pain   Complete by: As directed    Call MD for:  temperature >100.4   Complete by: As directed    Diet - low sodium heart healthy   Complete by: As directed    Increase activity slowly   Complete by: As directed       Signed: Riesa Pope, MD 10/03/2020, 6:12 PM   Pager: [phone]

## 2020-10-03 NOTE — Progress Notes (Addendum)
   Pt has been approved for hospice care at home with [facility]. Family has accepted the equipmetn which was delivered last evening and if pt stable and MD ready for d/c today. We have a nurse who would tentatively be able to see pt around 200-230pm if this is doable. He will need to go home by ambulance and I have reached out to transition of care to update them as well.   Mollie Germany RN BSN [facility] [phone]

## 2020-10-03 NOTE — TOC Transition Note (Signed)
Transition of Care (TOC) - CM/SW Discharge Note Marvetta Gibbons RN, BSN Transitions of Care Unit 4E- RN Case Manager See Treatment Team for direct phone # Cross Coverage for [place]   Patient Details  Name: Scott Farmer MRN: [id number] Date of Birth: [birth year]  Transition of Care [facility]) CM/SW Contact:  Scott Patricia, RN Phone Number: 10/03/2020, 2:31 PM   Clinical Narrative:    Notified by [place] with [facility] that pt has been accepted for Home hospice services and they are prepared to admit pt this afternoon- DME has been delivered to the home. Per PC note referral was made to [facility] per family choice.  Per MD pt stable for transition home today, orders have been placed updated [place] with Hospice.  [facility] called for transport at 1315 however they have a list of patients and it will be several hours - most likely between 4-5 this evening for pick up.- Hospice updated for admission planning. Paperwork has been placed on chart along with GOLD DNR.   Final next level of care: Home w Hospice Care Barriers to Discharge: No Barriers Identified   Patient Goals and CMS Choice Patient states their goals for this hospitalization and ongoing recovery are:: return home with hospice      Discharge Placement               Home with Hospice        Discharge Plan and Services   Discharge Planning Services: CM Consult Post Acute Care Choice: Hospice                      [facility] Agency: [place] Date [place]: 10/03/20 Time [place]: 77 Representative spoke with at [place]: [place] ([place]) Interventions     Readmission Risk Interventions Readmission Risk Prevention Plan 10/03/2020  Transportation Screening Complete  PCP or Specialist Appt within 5-7 Days Complete  Home Care Screening Complete  Medication Review (RN CM) Complete

## 2020-10-03 NOTE — Anesthesia Postprocedure Evaluation (Signed)
Anesthesia Post Note  Patient: Damin Salido  Procedure(s) Performed: COLONOSCOPY WITH PROPOFOL (N/A ) HOT HEMOSTASIS (ARGON PLASMA COAGULATION/BICAP) (N/A )     Patient location during evaluation: Endoscopy Anesthesia Type: MAC Level of consciousness: awake and alert Pain management: pain level controlled Vital Signs Assessment: post-procedure vital signs reviewed and stable Respiratory status: spontaneous breathing, nonlabored ventilation, respiratory function stable and patient connected to nasal cannula oxygen Cardiovascular status: blood pressure returned to baseline and stable Postop Assessment: no apparent nausea or vomiting Anesthetic complications: no   No complications documented.  Last Vitals:  Vitals:   10/03/20 0610 10/03/20 0627  BP: (!) 86/56 97/60  Pulse: 81   Resp: 12 15  Temp: 36.7 C 36.8 C  SpO2: 96% 98%    Last Pain:  Vitals:   10/03/20 0627  TempSrc: Oral  PainSc:                  Persis Graffius L Tramon Crescenzo

## 2020-10-03 NOTE — Progress Notes (Incomplete)
HD#3 Subjective:  Overnight Events: Hgb of 6.7, ordered 1 U    Objective:  Vital signs in last 24 hours: Vitals:   10/03/20 0527 10/03/20 0530 10/03/20 0610 10/03/20 0627  BP: (!) 99/59 (!) 87/56 (!) 86/56 97/60  Pulse:  88 81   Resp: 13 15 12 15   Temp: 97.9 F (36.6 C) 98.3 F (36.8 C) 98 F (36.7 C) 98.3 F (36.8 C)  TempSrc: Oral Oral Oral Oral  SpO2: 98% 96% 96% 98%  Weight:      Height:       Supplemental O2: Room Air SpO2: 98 % O2 Flow Rate (L/min): 2 L/min   Physical Exam:  Constitutional: Ill/toxic appearing HENT: temporal wasting Eyes: conjunctiva jaundiced Neck: supple Cardiovascular: regular rate and rhythm Pulmonary/Chest: normal work of breathing on room air MSK: thin,  Neurological: alert & oriented x 3 Skin: cool, jaundiced Psych: Normal mood  Filed Weights   09/30/20 0756  Weight: 79.2 kg     Intake/Output Summary (Last 24 hours) at 10/03/2020 0750 Last data filed at 10/03/2020 0546 Gross per 24 hour  Intake 1385 ml  Output 802 ml  Net 583 ml   Net IO Since Admission: 8,352.76 mL [10/03/20 0750]  Pertinent Labs: CBC Latest Ref Rng & Units 10/03/2020 10/03/2020 10/02/2020  WBC 4.0 - 10.5 K/uL 11.0(H) 11.2(H) 13.0(H)  Hemoglobin 13.0 - 17.0 g/dL 6.7(LL) 7.3(L) 7.3(L)  Hematocrit 39.0 - 52.0 % 19.0(L) 20.5(L) 22.1(L)  Platelets 150 - 400 K/uL 137(L) 145(L) 160    CMP Latest Ref Rng & Units 10/03/2020 10/02/2020 10/01/2020  Glucose 70 - 99 mg/dL 123(H) 113(H) 130(H)  BUN 8 - 23 mg/dL 20 24(H) 26(H)  Creatinine 0.61 - 1.24 mg/dL 1.53(H) 1.75(H) 1.97(H)  Sodium 135 - 145 mmol/L 138 140 142  Potassium 3.5 - 5.1 mmol/L 3.0(L) 2.9(L) 4.2  Chloride 98 - 111 mmol/L 107 109 108  CO2 22 - 32 mmol/L 23 23 22   Calcium 8.9 - 10.3 mg/dL 7.4(L) 7.5(L) 8.1(L)  Total Protein 6.5 - 8.1 g/dL - - -  Total Bilirubin 0.3 - 1.2 mg/dL - - -  Alkaline Phos 38 - 126 U/L - - -  AST 15 - 41 U/L - - -  ALT 0 - 44 U/L - - -    Imaging: No results  found.  Assessment/Plan:   Active Problems:   GI bleed   Hematochezia   Angiodysplasia of cecum   Patient Summary: Scott Farmer is a 65 y.o. with pertinent PMHx of Stg IIIA (T3N0M0) multifocal hepatocellular carcinoma dx Feb 2021 currently treated with palliative chemotherapy 4x a month, Hx treated Hep C., T2DM, HTN, HLD, GERD who presented with weakness, hematemesis, bloody diarrhea, and syncope and admit for suspected GI bleed and further evaluation  Syncope 2/2 Acute GI Bleed Hepatocellular Carcinoma Patient taken back for repeat colonoscopy today after completing bowel prep. Single non-bleeding colonic angiodysplastic lesion. Treated with argon beam coagulation. Patient denies any bright red stools today. GI signing off. Patient has had further discussion with palliative concerning home care vs hospice. Changed to DNR during admission -restart diet per GI -CBC q6h -continuous LR 125 cc/hr -IV protonix -palliative consult following.  Acute on Chronic Kidney Injury IIIB Cr of 1.97>1.75. Suspect pre-renal etiology in setting of severe hypovolemia -fluid resuscitating as per above.   Malnutrition 2/2 Advanced [place] Patient with temporal wasting, frail. Patient with decreased appetite recently. Albumin of 1.7. -nutrition consult placed  Hypokalemia 2.9 from 4.2 -40 meq KCl  BID  Diet: Clear Liquids IVF: LR,125cc/hr VTE: None Code: DNR  Dispo: Anticipated discharge to [facility] pending further workup and further goals of care discussions  [facility] Resident PGY-1 Pager [phone] Please contact the on call pager after 5 pm and on weekends at [phone].

## 2020-10-04 LAB — BPAM RBC
Blood Product Expiration Date: 202202232359
Blood Product Expiration Date: 202202232359
Blood Product Expiration Date: 202202232359
Blood Product Expiration Date: 202202232359
ISSUE DATE / TIME: 202201221310
ISSUE DATE / TIME: 202201221510
ISSUE DATE / TIME: 202201231311
ISSUE DATE / TIME: 202201250537
Unit Type and Rh: 5100
Unit Type and Rh: 5100
Unit Type and Rh: 5100
Unit Type and Rh: 5100

## 2020-10-04 LAB — TYPE AND SCREEN
ABO/RH(D): O POS
Antibody Screen: NEGATIVE
Unit division: 0
Unit division: 0
Unit division: 0
Unit division: 0

## 2020-10-05 ENCOUNTER — Encounter (HOSPITAL_COMMUNITY): Payer: Self-pay | Admitting: Internal Medicine

## 2020-10-05 ENCOUNTER — Other Ambulatory Visit: Payer: Self-pay | Admitting: Pharmacist

## 2020-10-05 LAB — CULTURE, BLOOD (ROUTINE X 2)
Culture: NO GROWTH
Culture: NO GROWTH
Special Requests: ADEQUATE

## 2020-10-06 NOTE — Progress Notes (Incomplete)
[facility]  [address] [place],    [phone]  Clinic Day:  10/06/2020  Referring physician: Cyndi Bender, PA-C   This document serves as a record of services personally performed by Hosie Poisson, MD. It was created on their behalf by Curry,Lauren E, a trained medical scribe. The creation of this record is based on the scribe's personal observations and the provider's statements to them.   CHIEF COMPLAINT:  CC: Hepatocellular Carcinoma  Current Treatment:  Ipilimumab/nivolumab   HISTORY OF PRESENT ILLNESS:  The patient is a 65 year old with stage IIIA (T3 N0 M0) multifocal hepatocellular carcinoma diagnosed in February 2021.  He was referred by Cyndi Bender, PA-C in January due to abnormal liver function tests.  He has a history of hepatitis C, which was treated.  Due to abdominal pain and bloating, diarrhea, edema and right upper quadrant ultrasound was done in January.  This revealed at least 3 masses within the liver, the largest measured 10.2 x 9.3 x 9.5 cm.  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  The patient was started on rivaroxaban and then referred for consultation.  He had worsening anemia with a hemoglobin of 10.1.  B12 and folate were normal.  Iron studies were equivocal with a low serum iron of 34 and% saturation of 8.3% and high normal TIBC of 409, but ferritin was normal at 122.  A soluble transferrin was obtained and was very elevated, consistent with iron deficiency.  Alpha-fetoprotein was normal, but his CA 19-9 was elevated.  CT chest, abdomen, and pelvis in January confirmed the multiple hepatic masses, favored to represent multifocal hepatocellular carcinoma in the setting of cirrhosis.  The largest mass measured 9.8 x 7.5 cm in the high right hepatic lobe.  Perihepatic hemorrhage was centered about the left  hepatic lobe, including a hematoma measuring 8.1 x 11.0 cm.  No acute process or evidence of metastatic disease was seen in the chest.  MRI head did not reveal any intracranial metastasis.  Core liver biopsy was done in February.  Pathology from this procedure revealed well to moderately differentiated hepatocellular carcinoma, grade 1.  Due to the perihepatic hemorrhage, rivaroxaban was discontinued.  Due to the iron deficiency, he was advised start on oral iron supplement.  He had worsening hyperbilirubinemia in February prior to starting treatment, with his bilirubin up to 7.6, from 1.4.  Palliative treatment with single agent atezolizumab was recommended and started on February 25th.  This is usually given in combination with bevacizumab, but due to the hemorrhage within the tumor, we did not recommend bevacizumab.  His bilirubin went down to 5.7.  His 2nd cycle of palliative atezolizumab was delayed on March 18th due to worsening diarrhea, which he had for about 2 weeks.  Stool studies were negative.   He did not tolerate oral iron supplement, so we gave him IV iron in the form of Feraheme.  His CA 19-9 had increased from 246 in January to 358 in April, then down to 282 in early May.    CT chest, abdomen and pelvis from May 24th revealed the dominant right hepatic lesion is progressive in the interval, now measuring 11.6 x 9.8 cm, previously 9.8 x 7.5 cm.  The left hepatic mass remains poorly defined making reproducible comparative measurement difficult, but this lesion is similar to prior, now measuring 6.9 x 5.0 cm, previously  7.6 x 5.7 cm.  There is a progressive enhancing lesion in the inferior right liver measuring 2.9 cm.  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  Question of IVC thrombus on the previous study is less evident.  With these results, we stopped his treatment.  The patient started Lenvima  12 mg daily on 02/10/2020.  On 02/17/2020, he called reporting severe diarrhea that was not improved with Imodium.  We decreased his dose due to toxicities and started Lomotil p.r.n..  He then required Lenvima to be held due to Grade 3 hepatotoxicity for a total of two weeks.  He restarted Lenvima at 8 mg but continued to have severe toxicities of diarrhea and weight loss.  He was unable to tolerate even 4 mg of the Lenvima. The oxycodone was not sufficient to relieve his pain and he was changed to hydromorphone. Tramadol and hydrocodone did not provide sufficient relief.  He has not tolerated oxycodone, so he was placed on morphine 15 mg Q4 prn.  CT abdomen and pelvis from September 21st revealed multiple enlarging, bulky liver masses, and an additional new lesion, measuring 1.5 x 1.3 cm.  The largest mass in the right lobe measures 12.3 x 10.0 cm, previously 11.6 x 9.8 cm.  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  His last CA 19-9 from early September was 438, previously 299 in July.  He was started on ipilimumab/nivolumab immunotherapy on September 28th and completed 2 cycles.  He was seen on November 11th due to reporting dizziness with a fall the night prior.  CT head revealed an acute right hemispheric subdural hematoma measuring up to 7 mm in thickness, with minimal mass effect upon the underlying right cerebral hemisphere and no midline shift.  There was a small right parietal scalp hematoma.  No evidence of intracranial metastatic disease was seen.  When he was seen in December, he had increased edema, left greater than right, but Doppler was negative for DVT.  INTERVAL HISTORY:  He is here for follow up prior to his 1st cycle of maintenance nivolumab.  He states that he has been doing well other than itching of the skin across the entire body.  This has persisted despite ointment, Zyrtec, Claritin and Benadryl.  We will try him on Vistaril 50 mg up to three times  daily.  He continues to have abdominal pain which he rates as an 8/10 today.  He continues his current pain regimen of MS Contin 15 mg every 12 hours and Dilaudid 2 mg every 5-6 hours.  He continues to have insomnia despite Remeron 30 mg and lorazepam 1 mg at bedtime.  His edema has improved.  His hemoglobin has improved from 9.9 to 11.6, and his white count and platelets are normal.  Chemistries are unremarkable except for a creatinine of 1.4, improved from 1.5, a stable total bilirubin of 1.5, an SGOT of 92, improved, a SGPT of 117, worse, and an alkaline phosphatase of 465, stable to mildly worse.  CA 19-9 from December was 557, previously 438 in October.  His  appetite is good, and he has lost 3 and 1/2 pounds since his last visit.  He continues to work on his diet and supplements with Boost daily.  He denies fever, chills or other signs of infection.  He denies nausea, vomiting, or bowel issues.  He denies sore throat, cough,  dyspnea, or chest pain.  He is here for follow up and supportive care.  He has been placed on Hospice.     His  appetite is good, and he has gained/lost _ pounds since his last visit.  He denies fever, chills or other signs of infection.  He denies nausea, vomiting, bowel issues, or abdominal pain.  He denies sore throat, cough, dyspnea, or chest pain.  REVIEW OF SYSTEMS:  Review of Systems - Oncology   VITALS:  There were no vitals taken for this visit.  Wt Readings from Last 3 Encounters:  09/30/20 174 lb 8 oz (79.2 kg)  09/21/20 174 lb 8 oz (79.2 kg)  09/18/20 179 lb 6.4 oz (81.4 kg)    There is no height or weight on file to calculate BMI.  Performance status (ECOG): 1 - Symptomatic but completely ambulatory  PHYSICAL EXAM:  Physical ExamLymph Nodes:  There is no palpable cervical, clavicular, axillary or inguinal lymphadenopathy.   LABS:   His hemoglobin has improved from 9.9 to 11.6, and his white count and platelets are normal.  Chemistries are unremarkable  except for a creatinine of 1.4, improved from 1.5, a stable total bilirubin of 1.5, an SGOT of 92, improved, a SGPT of 117, worse, and an alkaline phosphatase of 465, stable to mildly worse.  CA 19-9 from December was 557, previously 438 in October.  STUDIES:   No current studies.  Allergies:  Allergies  Allergen Reactions  . Hydromorphone Itching  . Lortab [Hydrocodone-Acetaminophen] Itching    Current Medications: Current Outpatient Medications  Medication Sig Dispense Refill  . LORazepam (ATIVAN) 1 MG tablet TAKE 1 TABLET BY MOUTH EVERY 8 HOURS AS NEEDED FOR ANXIETY (Patient not taking: No sig reported) 60 tablet 0  . cetirizine (ZYRTEC) 10 MG tablet TAKE 1 TABLET BY MOUTH EVERY DAY 30 tablet 1  . HYDROmorphone (DILAUDID) 2 MG tablet Take 1 tablet (2 mg total) by mouth every 4 (four) hours as needed for severe pain. (Patient not taking: Reported on 09/30/2020) 150 tablet 0  . hydrOXYzine (VISTARIL) 50 MG capsule Take 1 capsule (50 mg total) by mouth 3 (three) times daily as needed. (Patient taking differently: Take 50 mg by mouth 3 (three) times daily as needed for itching.) 60 capsule 5  . lip balm (CARMEX) ointment Apply topically as needed for lip care. 7 g 0  . nicotine (NICODERM CQ - DOSED IN MG/24 HOURS) 14 mg/24hr patch Place 1 patch (14 mg total) onto the skin daily. 28 patch 0  . nystatin-triamcinolone ointment (MYCOLOG) Apply 1 application topically 2 (two) times daily. (Patient not taking: No sig reported) 30 g 3  . Oxycodone HCl 10 MG TABS Take 10 mg by mouth every 4 (four) hours as needed (pain).     No current facility-administered medications for this visit.     ASSESSMENT & PLAN:   Assessment:  1. Stage IIIA hepatocellular carcinoma, for which he was receiving palliative atezolizumab. However, imaging results revealed progressive disease, and was therefore stopped.  He began lenvatinib 12 mg daily in June, but was unable to tolerate that even at lower doses.  He is  now receiving palliative ipilimumab/ nivolumab, and has completed 4 cycles.  We will now start maintenance nivolumab monthly.  2. Hemorrhage into the tumor, resolved.    3. Iron deficiency anemia, improved on IV iron.  He has not required IV iron since March.    4. Hyperbilirubinemia and elevation of the liver transaminases.  These have  started to improve.  Most likely this is treatment induced.  5. 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  He has been instructed to stay off anticoagulant or aspirin.  6. History of hepatitis C, treated.  7. Chronic lower extremity swelling and pain, which has improved.  He continues hydrochlorothiazide.  We do not recommend resuming furosemide due to the worsening renal function.  Doppler ultrasound was negative for DVT.  I did recommend compression hose.    8. Chronic kidney disease, improved.    He has been drinking plenty of fluids and does not appear dehydrated.  9. Small, 7 mm right subdural hematoma after a fall in November. He was discharged home to have close observation by his wife.  He has no neurologic signs or symptoms today.  10.  Moderate whole body skin pruritis despite ointment, Zyrtec, Claritin and Benadryl.  This could be due to his treatment, his liver disease, or reaction to other medications such as morphine.  We will try having him stop the MS Contin, and I will try him on Vistaril 50 mg up to three times daily.    Plan:     As he is having whole body pruritis, I will try him on Vistaril 50 mg up to three daily, and advised that he discontinue the MS Contin to see if this improves.  He may continue Dilaudid 2 mg as needed for his pain, and I will send in a refill today.  If the pruritis is caused by his treatment, this may improve as he is now only on maintenance therapy. We will see him back in 4 weeks with CBC, CMP, CA 19-9 and CT abdomen and pelvis prior to a 2nd cycle of maintenance nivolumab.  The patient and his wife understand the  plans discussed today and are in agreement with them.  They know to contact our office if he develops issues requiring immediate clinical assessment.   Derwood Kaplan, MD [facility] [place]    I, Rita Ohara, am acting as scribe for Derwood Kaplan, MD  I have reviewed this report as typed by the medical scribe, and it is complete and accurate.

## 2020-10-09 ENCOUNTER — Inpatient Hospital Stay: Payer: Medicare Other | Admitting: Oncology

## 2020-10-10 DEATH — deceased

## 2020-10-16 ENCOUNTER — Ambulatory Visit: Payer: Medicare Other | Admitting: Oncology

## 2020-10-18 ENCOUNTER — Ambulatory Visit: Payer: Medicare Other

## 2022-01-10 IMAGING — CT CT HEAD W/O CM
4 series · 15 of 47 positions shown, 17 images · non-contrast
Comparison: 10/12/2019 head CT

CLINICAL DATA: Unwitnessed fall, syncope, head and neck injury,
history of hepatocellular carcinoma

EXAM:
CT HEAD WITHOUT CONTRAST
CT CERVICAL SPINE WITHOUT CONTRAST
TECHNIQUE: Multidetector CT imaging of the head and cervical spine was
performed following the standard protocol without intravenous
contrast. Multiplanar CT image reconstructions of the cervical spine
were also generated.

[Series 3: head without · axial · non-contrast · 0.49mm/px · z∈[-67,+53]mm · 7 of 34 slices shown, 9 images]
[im 5/34  brain]
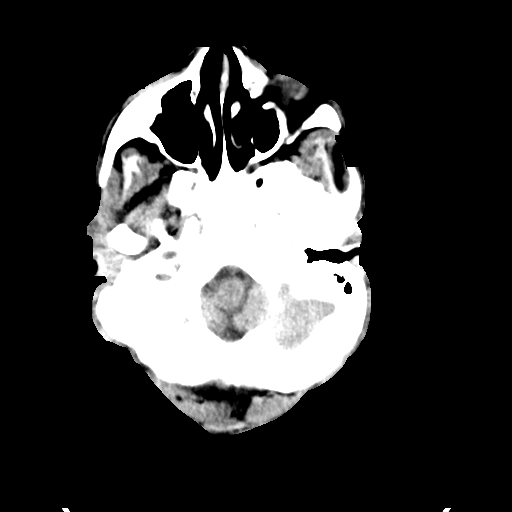
[im 5/34  bone]
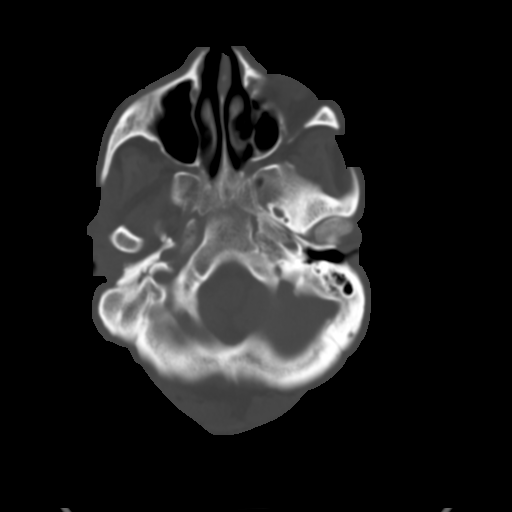
[im 9/34  brain]
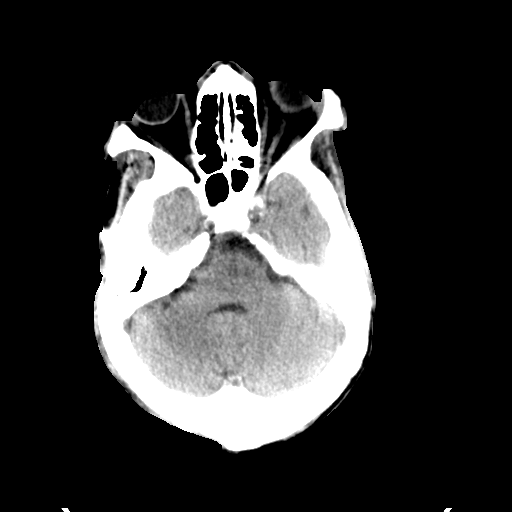
[im 13/34  brain]
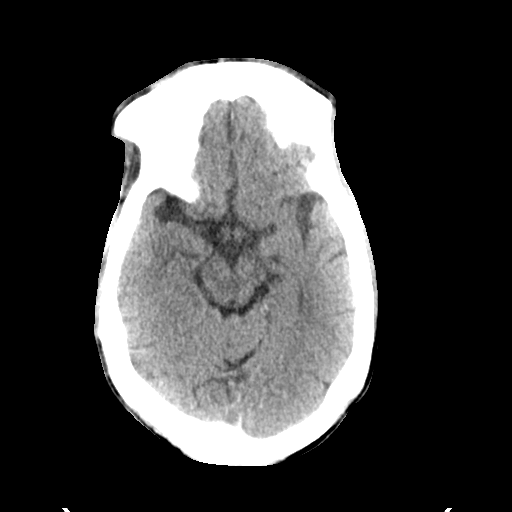
[im 17/34  brain]
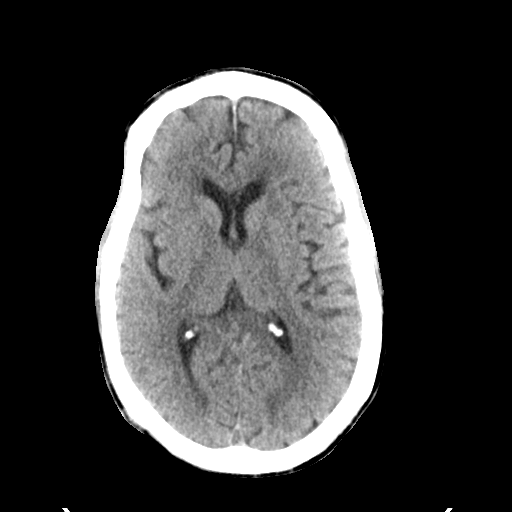
[im 21/34  brain]
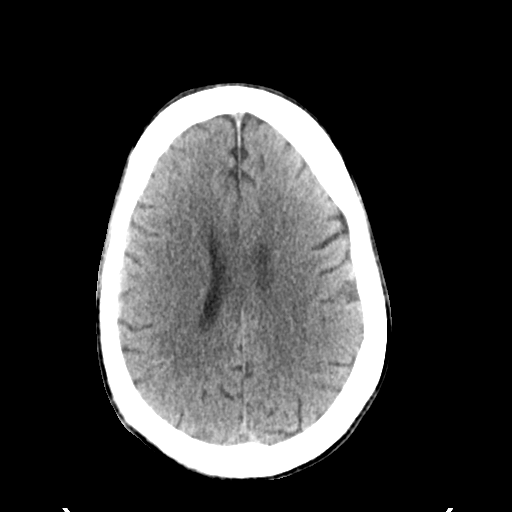
[im 21/34  bone]
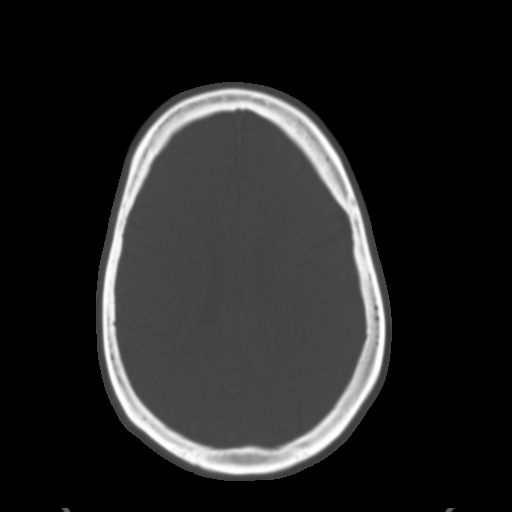
[im 25/34  brain]
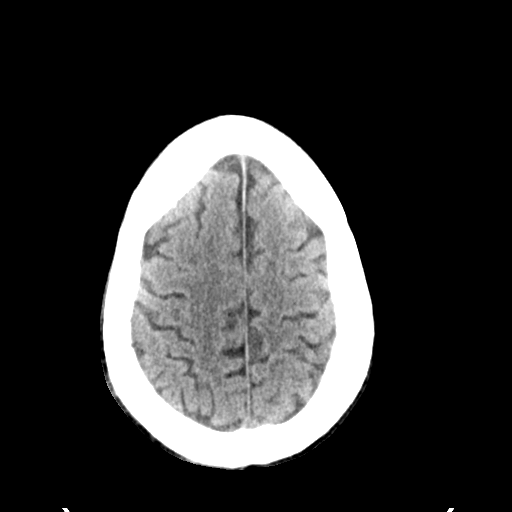
[im 29/34  brain]
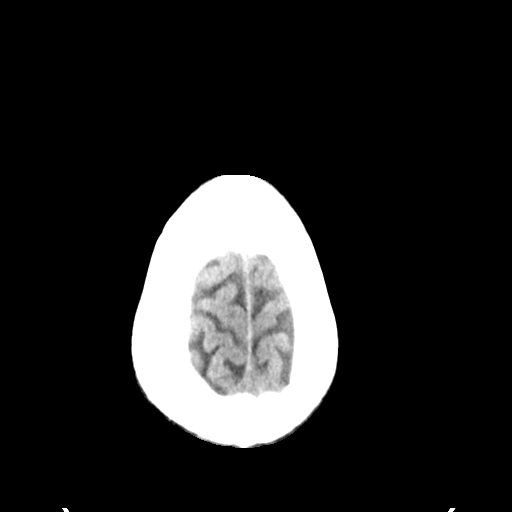

[Series 4: head bone · axial · 0.49mm/px · z∈[-71,-55]mm · 2 of 85 slices shown]
[im 9/85  bone]
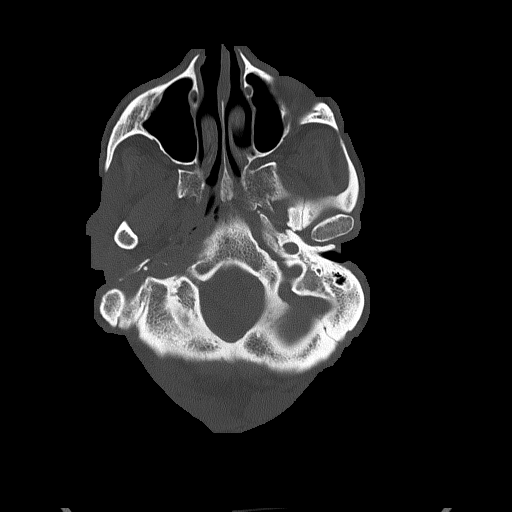
[im 17/85  bone]
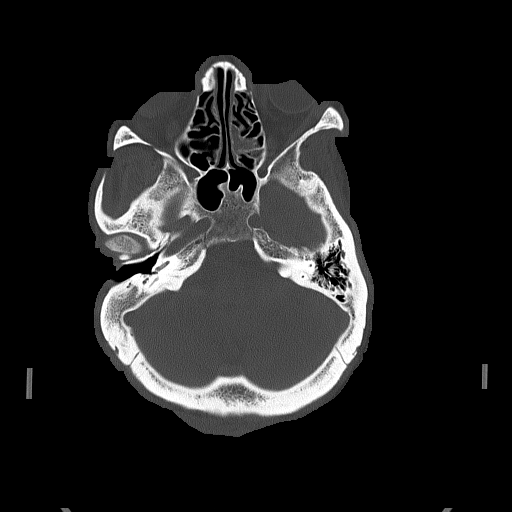

[Series 5: head without sag · sagittal · non-contrast · 0.38mm/px · 3 of 53 slices shown]
[im 18/53  brain]
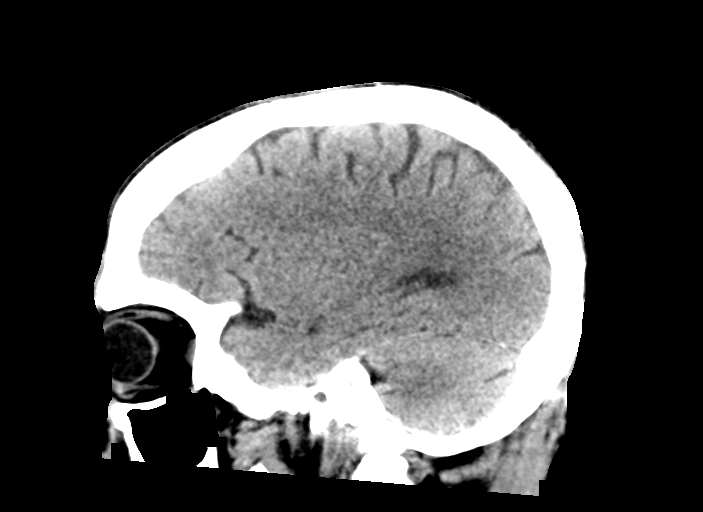
[im 27/53  brain]
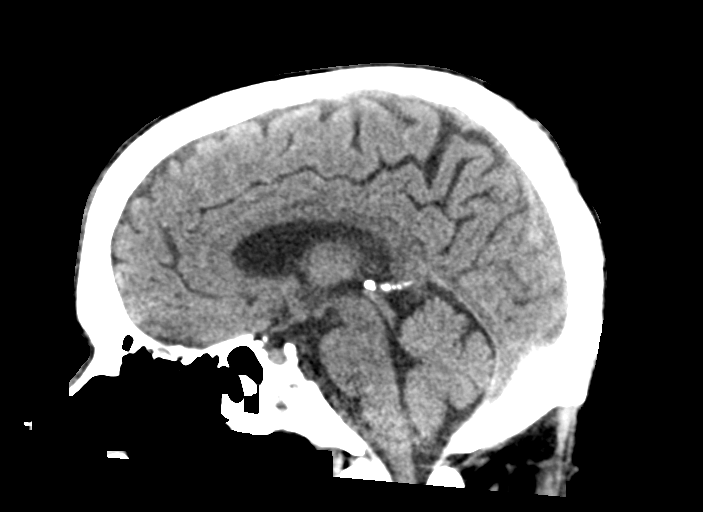
[im 35/53  brain]
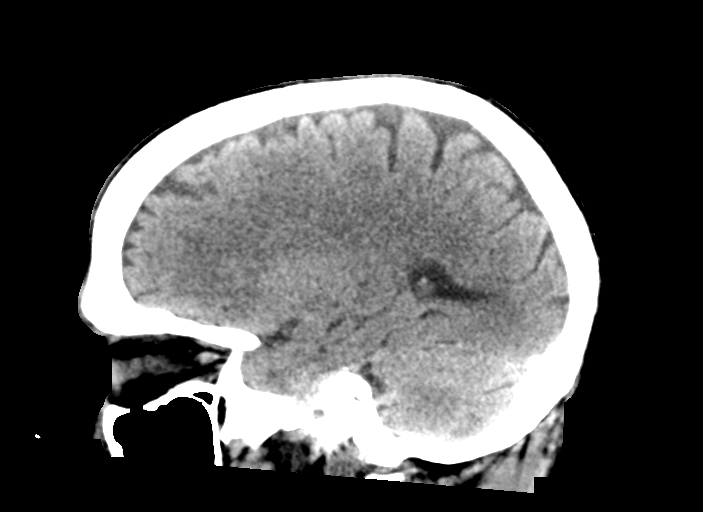

[Series 6: head without cor · coronal · non-contrast · 0.38mm/px · 3 of 92 slices shown]
[im 31/92  brain]
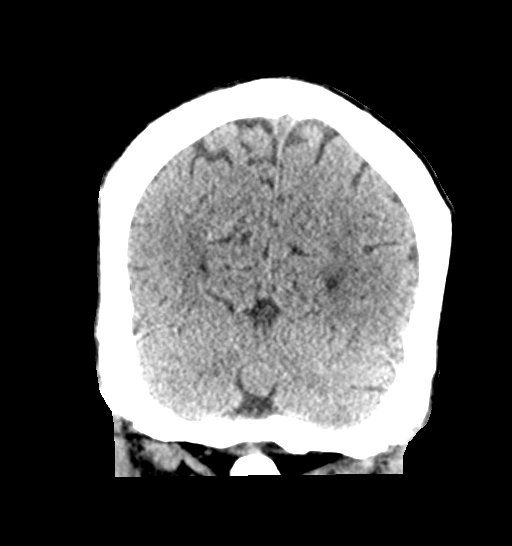
[im 41/92  brain]
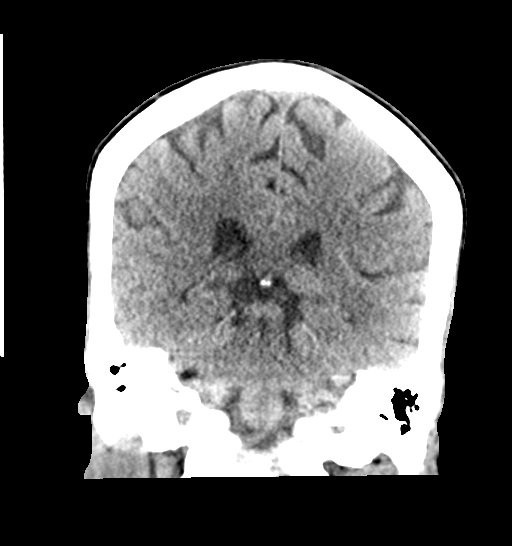
[im 51/92  brain]
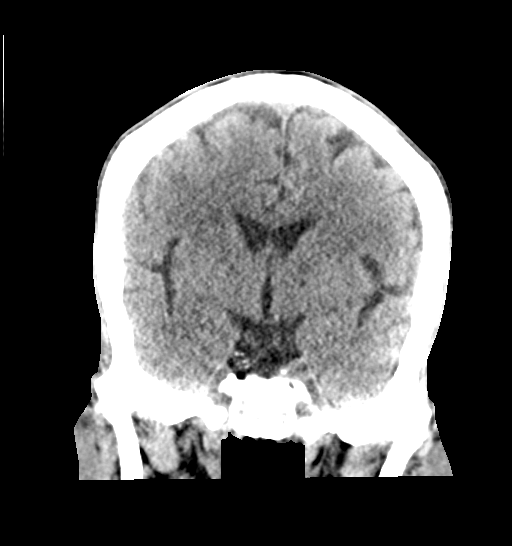

[15 of 47 positions shown; findings below may reference images not displayed]

FINDINGS: CT HEAD FINDINGS

Brain: Mild chronic white matter microvascular ischemic changes
about the lateral ventricles. No acute intracranial hemorrhage, mass
lesion, new infarction, midline shift, herniation, hydrocephalus, or
extra-axial fluid collection. No focal mass effect or edema.
Cisterns are patent. No gross cerebellar abnormality.

Vascular: No hyperdense vessel or unexpected calcification.

Skull: Normal. Negative for fracture or focal lesion.

Sinuses/Orbits: Orbits unremarkable. Scattered mild sinus mucosal
thickening.

Other: None.

CT CERVICAL SPINE FINDINGS

Alignment: Normal.

Skull base and vertebrae: No acute fracture. No primary bone lesion
or focal pathologic process.

Soft tissues and spinal canal: Normal prevertebral soft tissues.
Multifactorial mild acquired spinal stenosis at C4-5, C5-6, and
C6-7.

Disc levels: Mild mid and lower cervical degenerative disc disease.
Facets are aligned. No subluxation or dislocation.

Upper chest: Apical emphysema noted.

Other: Right IJ port catheter present. Carotid and aortic
atherosclerosis present.
IMPRESSION: No acute intracranial abnormality by noncontrast CT. Stable minor
white matter microvascular ischemic changes.

Cervical degenerative changes as above. No acute cervical spine
fracture or malalignment by CT.

Aortic Atherosclerosis (HEAC7-BRR.R) and Emphysema (HEAC7-SVD.2).

## 2022-01-10 IMAGING — CT CT ABD-PELV W/O CM
2 of 4 series · 15 of 46 positions shown, 17 images · non-contrast
Comparison: CT abdomen pelvis dated 05/30/2020.

CLINICAL DATA: 64-year-old male with hepatobiliary carcinoma and GI
bleed.

EXAM:
CT ABDOMEN AND PELVIS WITHOUT CONTRAST
TECHNIQUE: Multidetector CT imaging of the abdomen and pelvis was performed
following the standard protocol without IV contrast.

[Series 3: abd/ pelvis 5.0 i30f 2 · axial · 0.74mm/px · z∈[-125,+335]mm · 12 of 106 slices shown, 14 images]
[im 9/106  soft-tissue]
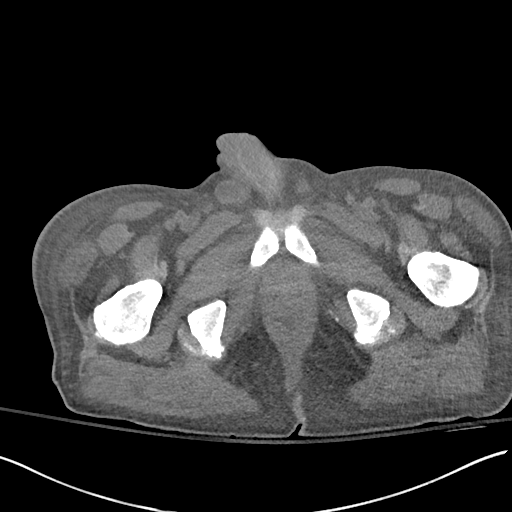
[im 9/106  bone]
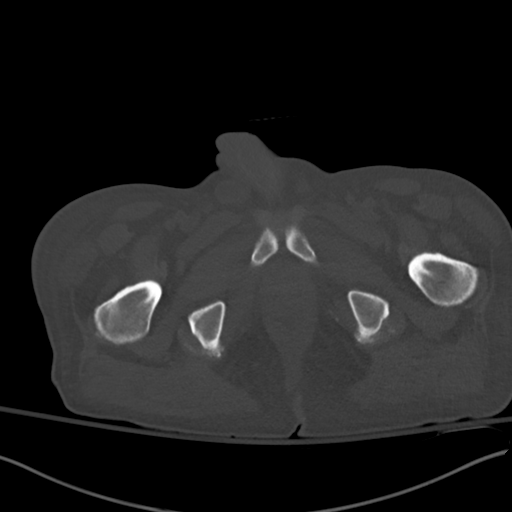
[im 17/106  soft-tissue]
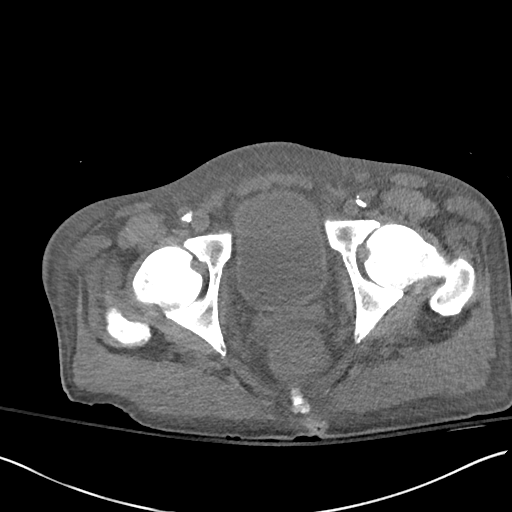
[im 26/106  soft-tissue]
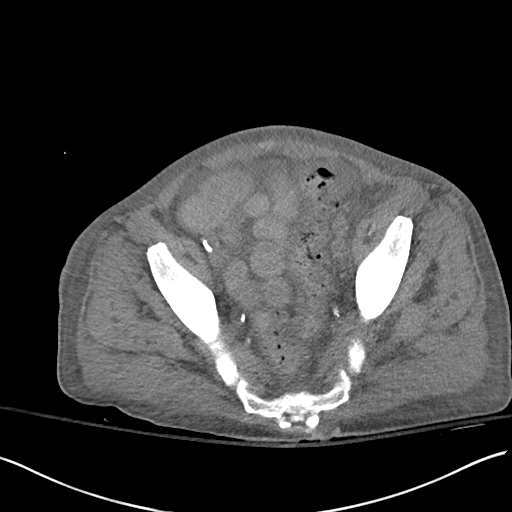
[im 34/106  soft-tissue]
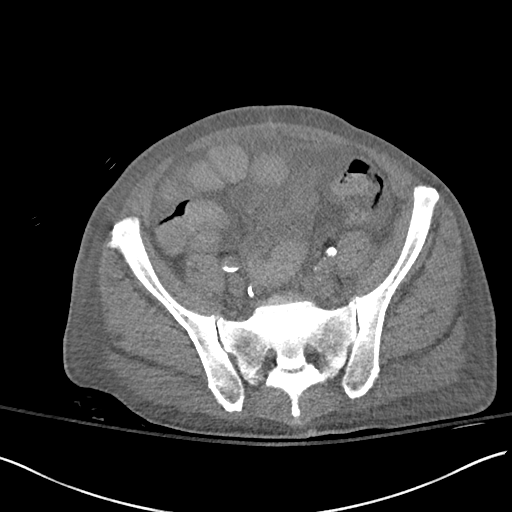
[im 43/106  soft-tissue]
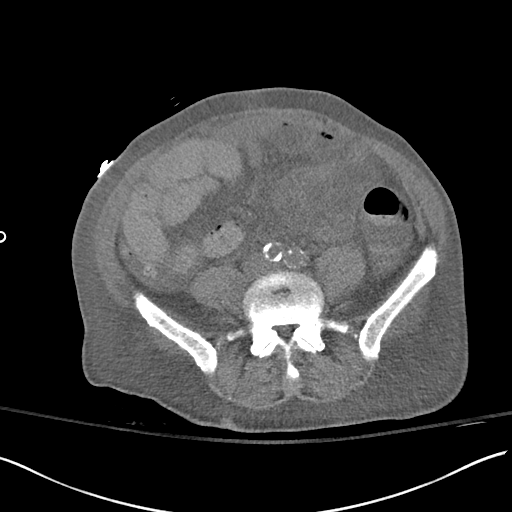
[im 51/106  soft-tissue]
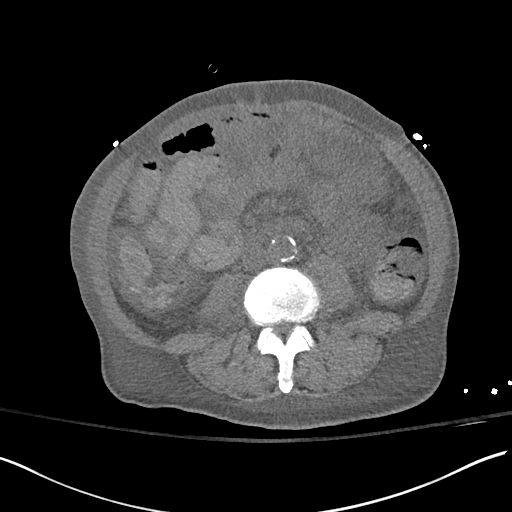
[im 59/106  soft-tissue]
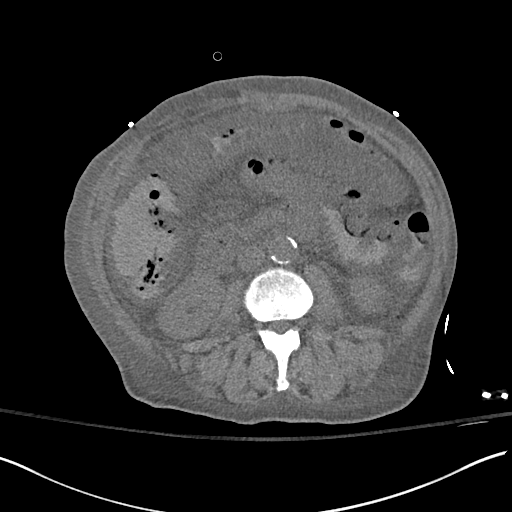
[im 68/106  soft-tissue]
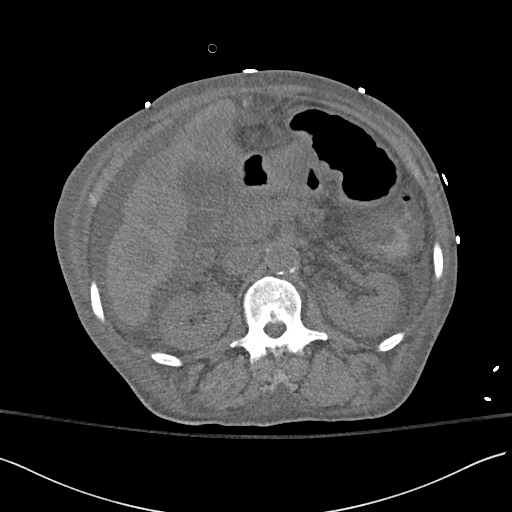
[im 76/106  soft-tissue]
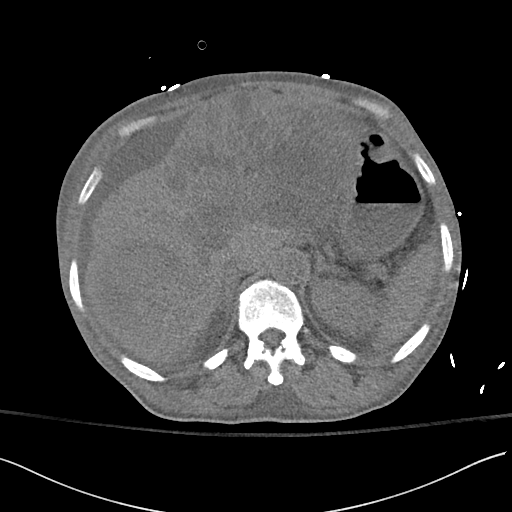
[im 76/106  bone]
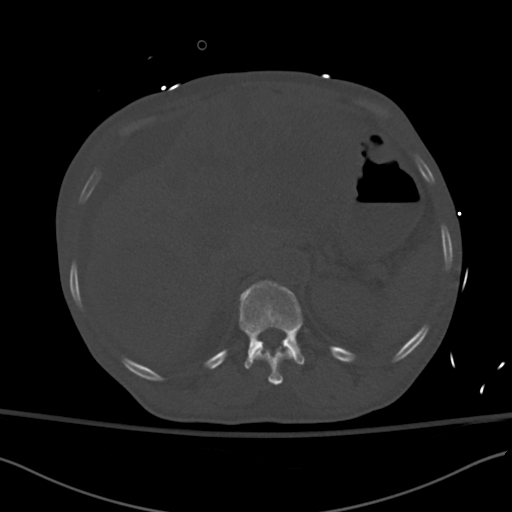
[im 85/106  soft-tissue]
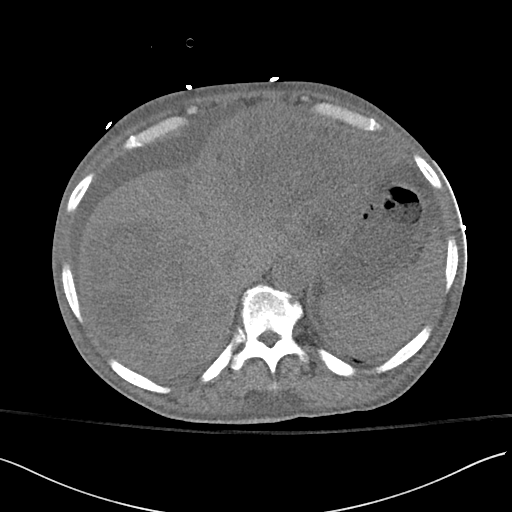
[im 93/106  soft-tissue]
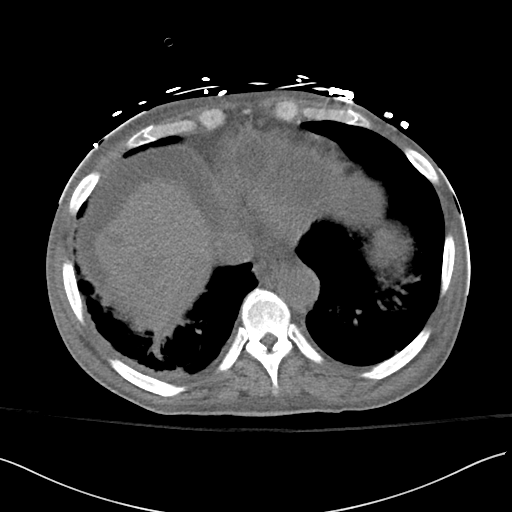
[im 101/106  soft-tissue]
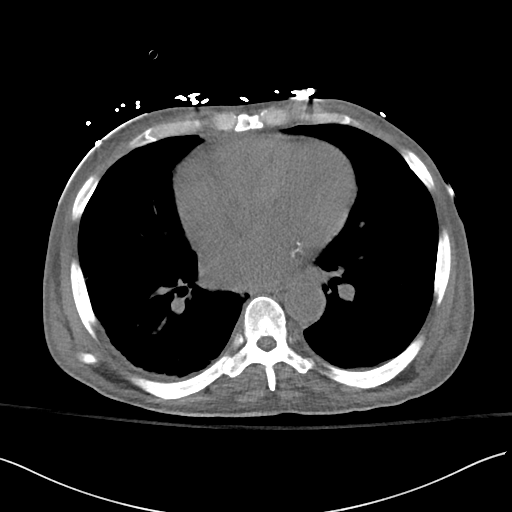

[Series 6: cor st · coronal · 0.77mm/px · 3 of 100 slices shown]
[im 34/100  soft-tissue]
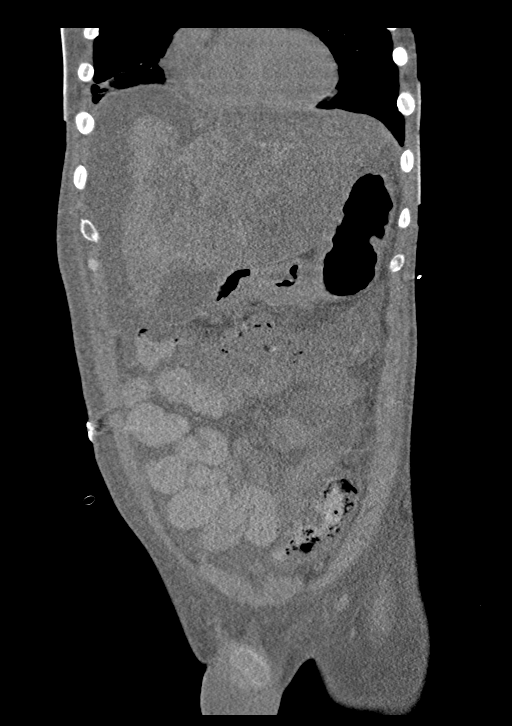
[im 45/100  soft-tissue]
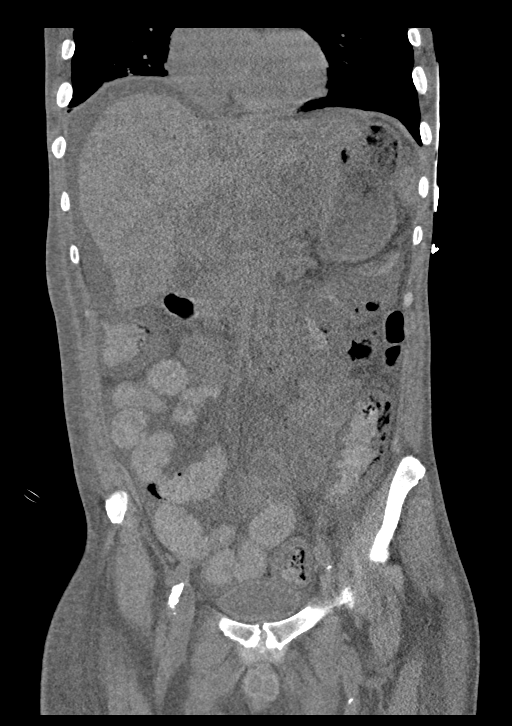
[im 56/100  soft-tissue]
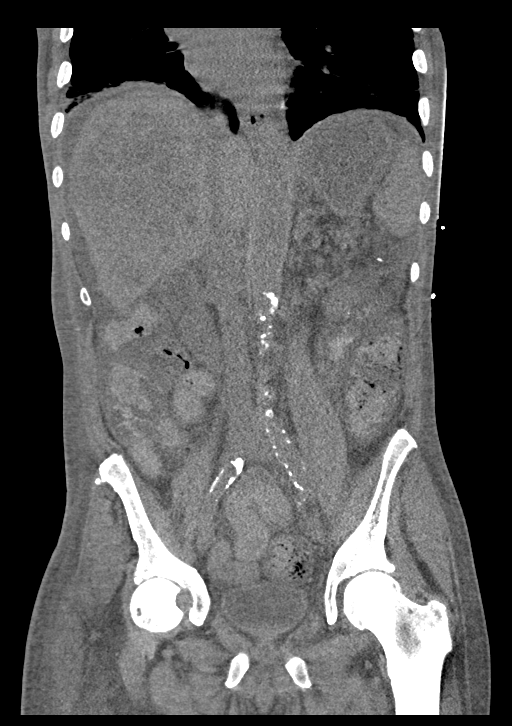

[15 of 46 positions shown; findings below may reference images not displayed]

FINDINGS: Evaluation of this exam is limited in the absence of intravenous
contrast. Evaluation is also limited due to anasarca and loss of
abdominal fat.

Lower chest: Trace right pleural effusions. There are bibasilar
linear atelectasis/scarring. Irregular nodular density along the
right posterior pleural surface may be related to atelectasis.
Pleural metastatic disease is not excluded. There is coronary
vascular calcification. There is hypoattenuation of the cardiac
blood pool suggestive of anemia. Clinical correlation is
recommended.

No intra-abdominal free air. Small ascites.

Hepatobiliary: Multiple hepatic hypodense lesions with near complete
replacement of the left lobe of the liver in keeping with known
malignancy. No gallstone.

Pancreas: The pancreas is poorly visualized. There is mild atrophic
appearance of the body and tail of the pancreas similar to prior CT.

Spleen: Normal in size without focal abnormality.

Adrenals/Urinary Tract: The adrenal glands unremarkable. There is no
hydronephrosis or nephrolithiasis on either side. The urinary
bladder is partially distended and grossly unremarkable.

Stomach/Bowel: Evaluation of the bowel is limited in the absence of
oral contrast and due to limiting factors mentioned above. There is
diffuse thickened appearance of the colon most concerning for
pancolitis. Correlation with clinical exam and stool cultures
recommended. No evidence of bowel obstruction. The appendix is
poorly visualized.

Vascular/Lymphatic: Moderate aortoiliac atherosclerotic disease. The
IVC is grossly unremarkable. No portal venous gas. No adenopathy.

Reproductive: The prostate and seminal vesicles are grossly
unremarkable. Small right inguinal hernia.

Other: Diffuse subcutaneous edema and anasarca.

Musculoskeletal: No acute or significant osseous findings.
IMPRESSION: 1. Pancolitis. Correlation with clinical exam and stool cultures
recommended. No bowel obstruction.
2. Multiple hepatic hypodense lesions with near complete replacement
of the left lobe of the liver in keeping with known malignancy.
3. Small ascites, diffuse subcutaneous edema, and anasarca.
4. Aortic Atherosclerosis ([id number]).
